# Patient Record
Sex: Female | Born: 1965 | Hispanic: Yes | Smoking: Never smoker
Health system: Southern US, Community
[De-identification: ages and names within clinical notes are randomized; demographics above are authoritative.]

## PROBLEM LIST (undated history)

## (undated) DIAGNOSIS — D373 Neoplasm of uncertain behavior of appendix: Secondary | ICD-10-CM

## (undated) DIAGNOSIS — K649 Unspecified hemorrhoids: Secondary | ICD-10-CM

## (undated) DIAGNOSIS — Z87442 Personal history of urinary calculi: Secondary | ICD-10-CM

## (undated) DIAGNOSIS — M1990 Unspecified osteoarthritis, unspecified site: Secondary | ICD-10-CM

## (undated) HISTORY — PX: CHOLECYSTECTOMY: SHX55

## (undated) HISTORY — PX: TUBAL LIGATION: SHX77

## (undated) HISTORY — DX: Neoplasm of uncertain behavior of appendix: D37.3

---

## 2013-01-23 ENCOUNTER — Ambulatory Visit: Payer: Self-pay | Admitting: Obstetrics and Gynecology

## 2016-01-22 ENCOUNTER — Emergency Department: Payer: Commercial Managed Care - PPO

## 2016-01-22 ENCOUNTER — Encounter: Payer: Self-pay | Admitting: Medical Oncology

## 2016-01-22 ENCOUNTER — Emergency Department
Admission: EM | Admit: 2016-01-22 | Discharge: 2016-01-22 | Disposition: A | Discharge status: Home / Self Care | Payer: Commercial Managed Care - PPO | Attending: Emergency Medicine | Admitting: Emergency Medicine

## 2016-01-22 DIAGNOSIS — R0789 Other chest pain: Secondary | ICD-10-CM | POA: Diagnosis not present

## 2016-01-22 DIAGNOSIS — R079 Chest pain, unspecified: Secondary | ICD-10-CM | POA: Diagnosis present

## 2016-01-22 LAB — CBC
HEMATOCRIT: 38.1 % (ref 35.0–47.0)
HEMOGLOBIN: 12.9 g/dL (ref 12.0–16.0)
MCH: 27.3 pg (ref 26.0–34.0)
MCHC: 33.8 g/dL (ref 32.0–36.0)
MCV: 80.7 fL (ref 80.0–100.0)
Platelets: 281 10*3/uL (ref 150–440)
RBC: 4.72 MIL/uL (ref 3.80–5.20)
RDW: 16.8 % — AB (ref 11.5–14.5)
WBC: 12.2 10*3/uL — AB (ref 3.6–11.0)

## 2016-01-22 LAB — BASIC METABOLIC PANEL
ANION GAP: 5 (ref 5–15)
BUN: 11 mg/dL (ref 6–20)
CO2: 27 mmol/L (ref 22–32)
Calcium: 9.4 mg/dL (ref 8.9–10.3)
Chloride: 106 mmol/L (ref 101–111)
Creatinine, Ser: 0.59 mg/dL (ref 0.44–1.00)
GFR calc Af Amer: >60 mL/min (ref >60)
Glucose, Bld: 122 mg/dL — ABNORMAL HIGH (ref 65–99)
POTASSIUM: 3.9 mmol/L (ref 3.5–5.1)
SODIUM: 138 mmol/L (ref 135–145)

## 2016-01-22 LAB — TROPONIN I: Troponin I: <0.03 ng/mL (ref <0.03)

## 2016-01-22 MED ORDER — KETOROLAC TROMETHAMINE 60 MG/2ML IM SOLN
60.0000 mg | Freq: Once | INTRAMUSCULAR | Status: AC
  Administered 2016-01-22: 60 mg via INTRAMUSCULAR
  Filled 2016-01-22: qty 2

## 2016-01-22 MED ORDER — IBUPROFEN 600 MG PO TABS: 600.0000 mg | ORAL_TABLET | Freq: Three times a day (TID) | ORAL | 0 refills | Status: DC | PRN

## 2016-01-22 NOTE — ED Notes (Signed)
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
## 2016-01-22 NOTE — Discharge Instructions (Signed)
Por favor, busque atencin mdica para cualquier fiebre alta, dolor en el pecho, dificultad para respirar, cambios de comportamiento, vmitos persistentes, heces sanguinolentas o cualquier otro sntoma nuevo o relacionado.

## 2016-01-22 NOTE — ED Triage Notes (Signed)
Pt reports she began having left sided chest pain/back pain and left arm pain Sunday night. Pain worsens with movement. Pt denies injury.

## 2017-02-10 ENCOUNTER — Other Ambulatory Visit: Payer: Self-pay | Admitting: Family Medicine

## 2017-02-10 DIAGNOSIS — Z1231 Encounter for screening mammogram for malignant neoplasm of breast: Secondary | ICD-10-CM

## 2017-03-02 ENCOUNTER — Encounter: Payer: Self-pay | Admitting: Radiology

## 2017-03-02 ENCOUNTER — Ambulatory Visit
Admission: RE | Admit: 2017-03-02 | Discharge: 2017-03-02 | Disposition: A | Discharge status: Home / Self Care | Payer: Commercial Managed Care - PPO | Source: Ambulatory Visit | Attending: Family Medicine | Admitting: Family Medicine

## 2017-03-02 DIAGNOSIS — Z1231 Encounter for screening mammogram for malignant neoplasm of breast: Secondary | ICD-10-CM

## 2017-06-27 ENCOUNTER — Other Ambulatory Visit: Payer: Self-pay

## 2017-06-27 ENCOUNTER — Telehealth: Payer: Self-pay

## 2017-06-27 DIAGNOSIS — Z1211 Encounter for screening for malignant neoplasm of colon: Secondary | ICD-10-CM

## 2017-06-27 MED ORDER — NA SULFATE-K SULFATE-MG SULF 17.5-3.13-1.6 GM/177ML PO SOLN: 1.0000 | Freq: Once | ORAL | 0 refills | Status: AC

## 2017-06-27 NOTE — Telephone Encounter (Signed)
Gastroenterology Pre-Procedure Review  Request Date: 07/07/17 Requesting Physician: Dr. Marius Ditch  PATIENT REVIEW QUESTIONS: The patient responded to the following health history questions as indicated:    1. Are you having any GI issues? no 2. Do you have a personal history of Polyps? no 3. Do you have a family history of Colon Cancer or Polyps? no 4. Diabetes Mellitus? no 5. Joint replacements in the past 12 months?no 6. Major health problems in the past 3 months?no 7. Any artificial heart valves, MVP, or defibrillator?no    MEDICATIONS & ALLERGIES:    Patient reports the following regarding taking any anticoagulation/antiplatelet therapy:   Plavix, Coumadin, Eliquis, Xarelto, Lovenox, Pradaxa, Brilinta, or Effient? no Aspirin? no  Patient confirms/reports the following medications:  Current Outpatient Medications  Medication Sig Dispense Refill  . ibuprofen (ADVIL,MOTRIN) 600 MG tablet Take 1 tablet (600 mg total) by mouth every 8 (eight) hours as needed. 20 tablet 0   No current facility-administered medications for this visit.     Patient confirms/reports the following allergies:  No Known Allergies  No orders of the defined types were placed in this encounter.   AUTHORIZATION INFORMATION Primary Insurance: 1D#: Group #:  Secondary Insurance: 1D#: Group #:  SCHEDULE INFORMATION: Date: 07/07/17 Time: Location:[facility]

## 2017-07-05 ENCOUNTER — Other Ambulatory Visit: Payer: Self-pay

## 2017-07-05 MED ORDER — PEG 3350-KCL-NA BICARB-NACL 420 G PO SOLR: 4000.0000 mL | Freq: Once | ORAL | 0 refills | Status: AC

## 2017-07-05 NOTE — Progress Notes (Signed)
This rx has been sent to pharmacy because insurance does not cover Su-Prep.

## 2017-07-06 ENCOUNTER — Encounter: Payer: Self-pay | Admitting: *Deleted

## 2017-07-07 ENCOUNTER — Encounter: Payer: Self-pay | Admitting: *Deleted

## 2017-07-07 ENCOUNTER — Ambulatory Visit: Payer: Commercial Managed Care - PPO | Admitting: Certified Registered Nurse Anesthetist

## 2017-07-07 ENCOUNTER — Encounter
Admission: RE | Disposition: A | Discharge status: Home / Self Care | Payer: Self-pay | Source: Ambulatory Visit | Attending: Gastroenterology

## 2017-07-07 ENCOUNTER — Ambulatory Visit
Admission: RE | Admit: 2017-07-07 | Discharge: 2017-07-07 | Disposition: A | Discharge status: Home / Self Care | Payer: Commercial Managed Care - PPO | Source: Ambulatory Visit | Attending: Gastroenterology | Admitting: Gastroenterology

## 2017-07-07 DIAGNOSIS — Z1211 Encounter for screening for malignant neoplasm of colon: Secondary | ICD-10-CM | POA: Diagnosis not present

## 2017-07-07 DIAGNOSIS — D124 Benign neoplasm of descending colon: Secondary | ICD-10-CM | POA: Diagnosis not present

## 2017-07-07 DIAGNOSIS — K639 Disease of intestine, unspecified: Secondary | ICD-10-CM | POA: Insufficient documentation

## 2017-07-07 DIAGNOSIS — D490 Neoplasm of unspecified behavior of digestive system: Secondary | ICD-10-CM | POA: Diagnosis not present

## 2017-07-07 DIAGNOSIS — Z6841 Body Mass Index (BMI) 40.0 and over, adult: Secondary | ICD-10-CM | POA: Insufficient documentation

## 2017-07-07 DIAGNOSIS — M1990 Unspecified osteoarthritis, unspecified site: Secondary | ICD-10-CM | POA: Diagnosis not present

## 2017-07-07 HISTORY — PX: COLONOSCOPY WITH PROPOFOL: SHX5780

## 2017-07-07 SURGERY — COLONOSCOPY WITH PROPOFOL
Anesthesia: General

## 2017-07-07 MED ORDER — LIDOCAINE HCL (CARDIAC) 20 MG/ML IV SOLN
INTRAVENOUS | Status: DC | PRN
  Administered 2017-07-07: 50 mg via INTRAVENOUS

## 2017-07-07 MED ORDER — SODIUM CHLORIDE 0.9 % IV SOLN
INTRAVENOUS | Status: DC
  Administered 2017-07-07: 10:00:00 via INTRAVENOUS

## 2017-07-07 MED ORDER — LIDOCAINE HCL (PF) 2 % IJ SOLN
INTRAMUSCULAR | Status: AC
  Filled 2017-07-07: qty 10

## 2017-07-07 MED ORDER — PROPOFOL 10 MG/ML IV BOLUS
INTRAVENOUS | Status: AC
  Filled 2017-07-07: qty 40

## 2017-07-07 MED ORDER — PROPOFOL 10 MG/ML IV BOLUS
INTRAVENOUS | Status: DC | PRN
  Administered 2017-07-07 (×4): 40 mg via INTRAVENOUS
  Administered 2017-07-07: 70 mg via INTRAVENOUS

## 2017-07-07 NOTE — Anesthesia Preprocedure Evaluation (Signed)
Anesthesia Evaluation  Patient identified by MRN, date of birth, ID band Patient awake    Reviewed: Allergy & Precautions, NPO status , Patient's Chart, lab work & pertinent test results  Airway Mallampati: III  TM Distance: <3 FB     Dental  (+) Caps, Chipped   Pulmonary neg pulmonary ROS,    Pulmonary exam normal        Cardiovascular negative cardio ROS Normal cardiovascular exam     Neuro/Psych negative neurological ROS  negative psych ROS   GI/Hepatic negative GI ROS,   Endo/Other  Morbid obesity  Renal/GU negative Renal ROS  negative genitourinary   Musculoskeletal  (+) Arthritis , Osteoarthritis,    Abdominal Normal abdominal exam  (+)   Peds negative pediatric ROS (+)  Hematology negative hematology ROS (+)   Anesthesia Other Findings   Reproductive/Obstetrics                             Anesthesia Physical Anesthesia Plan  ASA: II  Anesthesia Plan: General   Post-op Pain Management:    Induction: Intravenous  PONV Risk Score and Plan:   Airway Management Planned: Nasal Cannula  Additional Equipment:   Intra-op Plan:   Post-operative Plan:   Informed Consent: I have reviewed the patients History and Physical, chart, labs and discussed the procedure including the risks, benefits and alternatives for the proposed anesthesia with the patient or authorized representative who has indicated his/her understanding and acceptance.   Dental advisory given  Plan Discussed with: CRNA and Surgeon  Anesthesia Plan Comments:         Anesthesia Quick Evaluation

## 2017-07-07 NOTE — Anesthesia Post-op Follow-up Note (Signed)
Anesthesia QCDR form completed.        

## 2017-07-07 NOTE — Op Note (Signed)
[facility] Gastroenterology Patient Name: Jaime Brock Procedure Date: 07/07/2017 9:36 AM MRN: [id number] Account #: [id number] Date of Birth: [birth year] Admit Type: Outpatient Age: 52 Room: [facility] ENDO ROOM 4 Gender: Female Note Status: Finalized Procedure:            Colonoscopy Indications:          Screening for colorectal malignant neoplasm, This is                        the patient's first colonoscopy Providers:            Lin Landsman MD, MD Referring MD:         [facility]Barton Dubois (Referring MD) Medicines:            Monitored Anesthesia Care Complications:        No immediate complications. Estimated blood loss: None. Procedure:            Pre-Anesthesia Assessment:                       - Prior to the procedure, a History and Physical was                        performed, and patient medications and allergies were                        reviewed. The patient is competent. The risks and                        benefits of the procedure and the sedation options and                        risks were discussed with the patient. All questions                        were answered and informed consent was obtained.                        Patient identification and proposed procedure were                        verified by the physician, the nurse, the                        anesthesiologist, the anesthetist and the technician in                        the pre-procedure area in the procedure room in the                        endoscopy suite. Mental Status Examination: alert and                        oriented. Airway Examination: normal oropharyngeal                        airway and neck mobility. Respiratory Examination:                        clear to auscultation. CV Examination: normal.  Prophylactic Antibiotics: The patient does not require                        prophylactic antibiotics. Prior Anticoagulants: The                         patient has taken no previous anticoagulant or                        antiplatelet agents. ASA Grade Assessment: II - A                        patient with mild systemic disease. After reviewing the                        risks and benefits, the patient was deemed in                        satisfactory condition to undergo the procedure. The                        anesthesia plan was to use monitored anesthesia care                        (MAC). Immediately prior to administration of                        medications, the patient was re-assessed for adequacy                        to receive sedatives. The heart rate, respiratory rate,                        oxygen saturations, blood pressure, adequacy of                        pulmonary ventilation, and response to care were                        monitored throughout the procedure. The physical status                        of the patient was re-assessed after the procedure.                       After obtaining informed consent, the colonoscope was                        passed under direct vision. Throughout the procedure,                        the patient's blood pressure, pulse, and oxygen                        saturations were monitored continuously. The                        Colonoscope was introduced through the anus and  advanced to the the cecum, identified by appendiceal                        orifice and ileocecal valve. The colonoscopy was                        performed without difficulty. The patient tolerated the                        procedure well. The quality of the bowel preparation                        was evaluated using the BBPS [facility] Bowel Preparation                        Scale) with scores of: Right Colon = 3, Transverse                        Colon = 3 and Left Colon = 3 (entire mucosa seen well                        with no residual staining, small  fragments of stool or                        opaque liquid). The total BBPS score equals 9. Findings:      The perianal and digital rectal examinations were normal. Pertinent       negatives include normal sphincter tone and no palpable rectal lesions.      A 10 mm polypoid lesion was found appendiceal orifice. The lesion was       polypoid and submucosal. No bleeding was present.      A 7 mm polyp was found in the descending colon. The polyp was       semi-pedunculated. The polyp was removed with a hot snare. Resection and       retrieval were complete.      The retroflexed view of the distal rectum and anal verge was normal and       showed no anal or rectal abnormalities.      The exam was otherwise without abnormality. Impression:           - Likely benign polypoid lesion at the appendiceal                        orifice.                       - One 7 mm polyp in the descending colon, removed with                        a hot snare. Resected and retrieved.                       - The distal rectum and anal verge are normal on                        retroflexion view.                       - The examination was otherwise normal. Recommendation:       -  Discharge patient to home.                       - Resume previous diet today.                       - Continue present medications.                       - Await pathology results.                       - Perform a computed tomographic (CT scan) enterography                        to evaluate polypoid lesion on appendiceal orifice at                        appointment to be scheduled.                       - Repeat colonoscopy in 5 years for surveillance. Procedure Code(s):    --- Professional ---                       [phone], Colonoscopy, flexible; with removal of tumor(s),                        polyp(s), or other lesion(s) by snare technique Diagnosis Code(s):    --- Professional ---                       Z12.11, Encounter for  screening for malignant neoplasm                        of colon                       D49.0, Neoplasm of unspecified behavior of digestive                        system                       D12.4, Benign neoplasm of descending colon CPT copyright 2016 American Medical Association. All rights reserved. The codes documented in this report are preliminary and upon coder review may  be revised to meet current compliance requirements. Dr. Ulyess Mort Lin Landsman MD, MD 07/07/2017 10:08:06 AM This report has been signed electronically. Number of Addenda: 0 Note Initiated On: 07/07/2017 9:36 AM Scope Withdrawal Time: 0 hours 10 minutes 51 seconds  Total Procedure Duration: 0 hours 13 minutes 41 seconds       [facility]

## 2017-07-07 NOTE — Transfer of Care (Signed)
Immediate Anesthesia Transfer of Care Note  Patient: Jaime Brock  Procedure(s) Performed: COLONOSCOPY WITH PROPOFOL (N/A )  Patient Location: PACU and Endoscopy Unit  Anesthesia Type:General  Level of Consciousness: sedated  Airway & Oxygen Therapy: Patient Spontanous Breathing  Post-op Assessment: Report given to RN  Post vital signs: stable  Last Vitals:  Vitals:   07/07/17 0910 07/07/17 1000  BP: 124/74   Pulse: 66 66  Resp: 16 16  Temp: (!) 36.2 C 36.7 C  SpO2: 100% 100%    Last Pain:  Vitals:   07/07/17 1000  TempSrc: Tympanic         Complications: No apparent anesthesia complications

## 2017-07-07 NOTE — H&P (Addendum)
  Cephas Darby, MD [address]  [place]  [place], [place] [address]  Main: [phone]  Fax: [phone] Pager: [phone]  Primary Care Physician:  [facility] Primary Gastroenterologist:  Dr. Cephas Darby  Pre-Procedure History & Physical: HPI:  Jaime Brock is a 52 y.o. female is here for an colonoscopy.   History reviewed. No pertinent past medical history.  Past Surgical History:  Procedure Laterality Date  . CHOLECYSTECTOMY    . TUBAL LIGATION      Prior to Admission medications   Medication Sig Start Date End Date Taking? Authorizing Provider  ibuprofen (ADVIL,MOTRIN) 600 MG tablet Take 1 tablet (600 mg total) by mouth every 8 (eight) hours as needed. 01/22/16   Nance Pear, MD    Allergies as of 06/27/2017  . (No Known Allergies)    Family History  Problem Relation Age of Onset  . Breast cancer Neg Hx     Social History   Socioeconomic History  . Marital status: Widowed    Spouse name: Not on file  . Number of children: Not on file  . Years of education: Not on file  . Highest education level: Not on file  Social Needs  . Financial resource strain: Not on file  . Food insecurity - worry: Not on file  . Food insecurity - inability: Not on file  . Transportation needs - medical: Not on file  . Transportation needs - non-medical: Not on file  Occupational History  . Not on file  Tobacco Use  . Smoking status: Never Smoker  . Smokeless tobacco: Never Used  Substance and Sexual Activity  . Alcohol use: No    Frequency: Never  . Drug use: No  . Sexual activity: Not on file  Other Topics Concern  . Not on file  Social History Narrative  . Not on file    Review of Systems: See HPI, otherwise negative ROS  Physical Exam: BP 124/74   Pulse 66   Temp (!) 97.1 F (36.2 C) (Tympanic)   Resp 16   Ht 4\' 5"  (1.346 m)   Wt 209 lb (94.8 kg)   LMP  (LMP Unknown)   SpO2 100%   BMI 52.31 kg/m  General:    Alert,  pleasant and cooperative in NAD Head:  Normocephalic and atraumatic. Neck:  Supple; no masses or thyromegaly. Lungs:  Clear throughout to auscultation.    Heart:  Regular rate and rhythm. Abdomen:  Soft, nontender and nondistended. Normal bowel sounds, without guarding, and without rebound.   Neurologic:  Alert and  oriented x4;  grossly normal neurologically.  Impression/Plan: Jaime Brock is here for an colonoscopy to be performed for colon cancer screening   Risks, benefits, limitations, and alternatives regarding  colonoscopy have been reviewed with the patient.  Questions have been answered.  All parties agreeable.   Sherri Sear, MD  07/07/2017, 9:43 AM

## 2017-07-07 NOTE — Anesthesia Postprocedure Evaluation (Signed)
Anesthesia Post Note  Patient: Yolonda Purtle  Procedure(s) Performed: COLONOSCOPY WITH PROPOFOL (N/A )  Patient location during evaluation: PACU Anesthesia Type: General Level of consciousness: awake and alert Pain management: pain level controlled Vital Signs Assessment: post-procedure vital signs reviewed and stable Respiratory status: spontaneous breathing, nonlabored ventilation, respiratory function stable and patient connected to nasal cannula oxygen Cardiovascular status: blood pressure returned to baseline and stable Postop Assessment: no apparent nausea or vomiting Anesthetic complications: no     Last Vitals:  Vitals:   07/07/17 1010 07/07/17 1020  BP: (!) 95/56 (!) 89/51  Pulse: 71 65  Resp: (!) 24 (!) 26  Temp:    SpO2: 99% 100%    Last Pain:  Vitals:   07/07/17 1000  TempSrc: Tympanic                 Molli Barrows

## 2017-07-08 ENCOUNTER — Encounter: Payer: Self-pay | Admitting: Gastroenterology

## 2017-07-08 LAB — SURGICAL PATHOLOGY

## 2017-07-11 ENCOUNTER — Encounter: Payer: Self-pay | Admitting: Gastroenterology

## 2017-07-12 ENCOUNTER — Telehealth: Payer: Self-pay

## 2017-07-12 NOTE — Telephone Encounter (Signed)
Patient has been scheduled her CT Enterography at [facility] on 07/19/17 arrival time 10 am, nothing to eat or drink after midnight.  This message has been left on patients voice mail in [place] and [place] with the assistance of the interpreter.  Thanks [employer]

## 2017-07-19 ENCOUNTER — Ambulatory Visit
Admission: RE | Admit: 2017-07-19 | Discharge: 2017-07-19 | Disposition: A | Discharge status: Home / Self Care | Payer: Commercial Managed Care - PPO | Source: Ambulatory Visit | Attending: Gastroenterology | Admitting: Gastroenterology

## 2017-07-19 DIAGNOSIS — K381 Appendicular concretions: Secondary | ICD-10-CM | POA: Insufficient documentation

## 2017-07-19 DIAGNOSIS — K639 Disease of intestine, unspecified: Secondary | ICD-10-CM | POA: Diagnosis present

## 2017-07-19 DIAGNOSIS — N261 Atrophy of kidney (terminal): Secondary | ICD-10-CM | POA: Insufficient documentation

## 2017-07-19 DIAGNOSIS — M5136 Other intervertebral disc degeneration, lumbar region: Secondary | ICD-10-CM | POA: Diagnosis not present

## 2017-07-19 DIAGNOSIS — N200 Calculus of kidney: Secondary | ICD-10-CM | POA: Diagnosis not present

## 2017-07-19 MED ORDER — IOPAMIDOL (ISOVUE-300) INJECTION 61%
100.0000 mL | Freq: Once | INTRAVENOUS | Status: AC | PRN
  Administered 2017-07-19: 100 mL via INTRAVENOUS

## 2017-07-20 ENCOUNTER — Telehealth: Payer: Self-pay

## 2017-07-20 ENCOUNTER — Other Ambulatory Visit: Payer: Self-pay

## 2017-07-20 DIAGNOSIS — K639 Disease of intestine, unspecified: Secondary | ICD-10-CM

## 2017-07-20 NOTE — Telephone Encounter (Signed)
LVM for pt to contact office to get her lab results.

## 2017-07-20 NOTE — Progress Notes (Signed)
done

## 2017-07-26 NOTE — Telephone Encounter (Signed)
error 

## 2017-07-27 ENCOUNTER — Telehealth: Payer: Self-pay | Admitting: General Practice

## 2017-07-27 NOTE — Telephone Encounter (Signed)
Called Mr. Jaime Brock and answered his questions. He also wanted to switch his mother's appointment to be seen sooner. However, I told Jaime Brock that when I spoke to his mom, she was ademant to be seen until 08/17/2017 and in the afternoon so she could go to work in the morning. Jaime Brock then said that he will call his mother to make sure and that he will call us back to reschedule if she agreed to be seen sooner. I also told him that his mom wanted to be seen by a [place] speaking physician. So, I told Jaime Brock that I had a physician next week and then until the week of 08/17/2017. Jaime Brock understood and had no further questions.

## 2017-07-27 NOTE — Telephone Encounter (Signed)
Patients son was calling and just has questions about his mothers appointment. Please call and advise.

## 2017-08-17 ENCOUNTER — Ambulatory Visit: Payer: Commercial Managed Care - PPO | Admitting: Surgery

## 2017-08-17 VITALS — BP 123/73 | HR 73 | Temp 97.8°F | Ht 59.0 in | Wt 213.0 lb

## 2017-08-17 DIAGNOSIS — K388 Other specified diseases of appendix: Secondary | ICD-10-CM | POA: Insufficient documentation

## 2017-08-17 MED ORDER — ERYTHROMYCIN BASE 500 MG PO TABS: 500.0000 mg | ORAL_TABLET | Freq: Three times a day (TID) | ORAL | 0 refills | Status: DC

## 2017-08-17 MED ORDER — POLYETHYLENE GLYCOL 3350 17 GM/SCOOP PO POWD: 1.0000 | Freq: Once | ORAL | 0 refills | Status: AC

## 2017-08-17 MED ORDER — BISACODYL EC 5 MG PO TBEC: DELAYED_RELEASE_TABLET | ORAL | 0 refills | Status: DC

## 2017-08-17 MED ORDER — NEOMYCIN SULFATE 500 MG PO TABS: 500.0000 mg | ORAL_TABLET | Freq: Three times a day (TID) | ORAL | 0 refills | Status: DC

## 2017-08-17 NOTE — Patient Instructions (Signed)
Por favor llamenos si tiene alguna preguntas.   Vea su papel azul si tiene preguntas sobre su [place].

## 2017-08-17 NOTE — Progress Notes (Signed)
08/17/2017  Reason for Visit:  Appendiceal mucocele  Referring Provider:  Sherri Sear, MD  History of Present Illness: Jaime Brock is a 52 y.o. 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  The patient had a routine screening colonoscopy on 2/28 with Dr. Marius Ditch.  Colonoscopy showed a descending colon polyp which was removed and resulted in a tubular adenoma, and also a submucosal 10 mm polypoid lesion at the appendiceal orifice.  She had a subsequent CT scan which showed a dilated appendix to 10 mm with an appendicolith distally and a possible 1.5 x 1.2 cm lesion at the appendiceal orifice, which could be a mucocele.  The patient denies any symptoms.  She reports that her colonoscopy was for routine screening purposes and she's wondering why she's in our office today.  She denies any abdominal pain, nausea, vomiting, constipation or diarrhea or blood in the stool.  Past Medical History: --None  Past Surgical History: Past Surgical History:  Procedure Laterality Date  . CHOLECYSTECTOMY    . COLONOSCOPY WITH PROPOFOL N/A 07/07/2017   Procedure: COLONOSCOPY WITH PROPOFOL;  Surgeon: Lin Landsman, MD;  Location: [facility] ENDOSCOPY;  Service: Gastroenterology;  Laterality: N/A;  . TUBAL LIGATION      Home Medications: Prior to Admission medications   Medication Sig Start Date End Date Taking? Authorizing Provider  ibuprofen (ADVIL,MOTRIN) 600 MG tablet Take 1 tablet (600 mg total) by mouth every 8 (eight) hours as needed. 01/22/16  Yes Nance Pear, MD  BISACODYL 5 MG EC tablet Take 4 tablets at 8AM the day of your surgery. 08/30/17   Olean Ree, MD  erythromycin base (E-MYCIN) 500 MG tablet Take 1 tablet (500 mg total) by mouth 3 (three) times daily. Take 2 tablets at 8: 00 AM, 2:00 PM and 8:00 PM the day before surgery. 08/30/17   Kilah Drahos, Jacqulyn Bath, MD  neomycin (MYCIFRADIN) 500 MG tablet Take 1 tablet (500 mg total) by mouth 3 (three) times daily. Take 2 tablets at  8: 00 AM, 2:00 PM and 8:00 PM the day before surgery. 08/30/17   Kendrell Lottman, Jacqulyn Bath, MD  polyethylene glycol powder (GLYCOLAX/MIRALAX) powder Take 255 g by mouth once for 1 dose. Take 255 g by mouth starting at 2:00 PM the day before surgery until it is all gone. 08/30/17 08/30/17  Olean Ree, MD    Allergies: No Known Allergies  Social History:  reports that she has never smoked. She has never used smokeless tobacco. She reports that she does not drink alcohol or use drugs.   Family History: Family History  Problem Relation Age of Onset  . Diabetes Mother   . Gallbladder disease Mother   . Hypertension Mother   . Breast cancer Neg Hx   . Cancer Neg Hx     Review of Systems: Review of Systems  Constitutional: Negative for chills and fever.  HENT: Negative for hearing loss.   Respiratory: Negative for shortness of breath.   Cardiovascular: Negative for chest pain.  Gastrointestinal: Negative for abdominal pain, blood in stool, constipation, diarrhea, nausea and vomiting.  Genitourinary: Negative for dysuria.  Musculoskeletal: Negative for myalgias.  Skin: Negative for rash.  Neurological: Negative for dizziness.  Psychiatric/Behavioral: Negative for depression.    Physical Exam BP 123/73   Pulse 73   Temp 97.8 F (36.6 C) (Oral)   Ht 4\' 11"  (1.499 m)   Wt 213 lb (96.6 kg)   LMP  (LMP Unknown) Comment: Pt denies any chance of preg, pt does not wnat  to take preg test.   BMI 43.02 kg/m  CONSTITUTIONAL: No acute distress HEENT:  Normocephalic, atraumatic, extraocular motion intact. NECK: Trachea is midline, and there is no jugular venous distension.  RESPIRATORY:  Lungs are clear, and breath sounds are equal bilaterally. Normal respiratory effort without pathologic use of accessory muscles. CARDIOVASCULAR: Heart is regular without murmurs, gallops, or rubs. GI: The abdomen is soft, obese, nondistended, nontender to palpation.  Patient has umbilical incision from tubal ligation  and right subcostal incision from cholecystectomy, both well healed. There were no palpable masses.  MUSCULOSKELETAL:  Normal muscle strength and tone in all four extremities.  No peripheral edema or cyanosis. SKIN: Skin turgor is normal. There are no pathologic skin lesions.  NEUROLOGIC:  Motor and sensation is grossly normal.  Cranial nerves are grossly intact. PSYCH:  Alert and oriented to person, place and time. Affect is normal.  Laboratory Analysis: No results found for this or any previous visit (from the past 24 hour(s)).  Imaging: CT scan 3/12: IMPRESSION: 1. Abnormal caliber of the appendix at 10 mm as measured on image 68/6, with prominence of the appendix near the orifice from the cecum better seen on the delayed phase images. Appearance is not entirely specific, but given the lack of appreciable enhancement favors appendiceal mucocele. There is also a more distal appendicolith. Appendectomy should be considered. 2. 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 Mild associated left hydronephrosis. 3. Spurring of the sacroiliac joints. Lower lumbar degenerative disc disease.  Assessment and Plan: This is a 52 y.o. female who presents with a newly diagnosed appendiceal orifice lesion, possibly a mucocele.  I have independently viewed the patient's imaging study and agree that there is a lesion at the appendiceal orifice, unclear of what it is.  I also cannot fully see how much distance there is from this lesion to the ileocecal valve.  Discussed with the patient that the recommendation for this lesion would be resection.  The goal would be a laparoscopic appendectomy, with resecting a sliver of cecum in order to clear the orifice well and get clear margins.  However, also discussed with the patient that I cannot see for sure how much distance there is from the  orifice to the ileocecal valve, and if there is not much separation, I may not be able to remove a sliver of cecum and she could potentially need a laparoscopic right colectomy.  Discussed with the patient the risks of bleeding, infection, and injury to surrounding structures, and she's willing to proceed.  Given that she may need a more extensive surgery, would want her to get a bowel prep prior to surgery.  She will be scheduled for 08/31/17.  Patient is in agreement with this plan and all of her questions have been answered.  Face-to-face time spent with the patient and care providers was 60 minutes, with more than 50% of the time spent counseling, educating, and coordinating care of the patient.     Melvyn Neth, [place]

## 2017-08-17 NOTE — H&P (View-Only) (Signed)
08/17/2017  Reason for Visit:  Appendiceal mucocele  Referring Provider:  Sherri Sear, MD  History of Present Illness: Jaime Brock is a 52 y.o. 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  The patient had a routine screening colonoscopy on 2/28 with Dr. Marius Ditch.  Colonoscopy showed a descending colon polyp which was removed and resulted in a tubular adenoma, and also a submucosal 10 mm polypoid lesion at the appendiceal orifice.  She had a subsequent CT scan which showed a dilated appendix to 10 mm with an appendicolith distally and a possible 1.5 x 1.2 cm lesion at the appendiceal orifice, which could be a mucocele.  The patient denies any symptoms.  She reports that her colonoscopy was for routine screening purposes and she's wondering why she's in our office today.  She denies any abdominal pain, nausea, vomiting, constipation or diarrhea or blood in the stool.  Past Medical History: --None  Past Surgical History: Past Surgical History:  Procedure Laterality Date  . CHOLECYSTECTOMY    . COLONOSCOPY WITH PROPOFOL N/A 07/07/2017   Procedure: COLONOSCOPY WITH PROPOFOL;  Surgeon: Lin Landsman, MD;  Location[facility] ENDOSCOPY;  Service: Gastroenterology;  Laterality: N/A;  . TUBAL LIGATION      Home Medications: Prior to Admission medications   Medication Sig Start Date End Date Taking? Authorizing Provider  ibuprofen (ADVIL,MOTRIN) 600 MG tablet Take 1 tablet (600 mg total) by mouth every 8 (eight) hours as needed. 01/22/16  Yes Nance Pear, MD  BISACODYL 5 MG EC tablet Take 4 tablets at 8AM the day of your surgery. 08/30/17   Olean Ree, MD  erythromycin base (E-MYCIN) 500 MG tablet Take 1 tablet (500 mg total) by mouth 3 (three) times daily. Take 2 tablets at 8: 00 AM, 2:00 PM and 8:00 PM the day before surgery. 08/30/17   Hadleigh Felber, Jacqulyn Bath, MD  neomycin (MYCIFRADIN) 500 MG tablet Take 1 tablet (500 mg total) by mouth 3 (three) times daily. Take 2 tablets at  8: 00 AM, 2:00 PM and 8:00 PM the day before surgery. 08/30/17   Shakela Donati, Jacqulyn Bath, MD  polyethylene glycol powder (GLYCOLAX/MIRALAX) powder Take 255 g by mouth once for 1 dose. Take 255 g by mouth starting at 2:00 PM the day before surgery until it is all gone. 08/30/17 08/30/17  Olean Ree, MD    Allergies: No Known Allergies  Social History:  reports that she has never smoked. She has never used smokeless tobacco. She reports that she does not drink alcohol or use drugs.   Family History: Family History  Problem Relation Age of Onset  . Diabetes Mother   . Gallbladder disease Mother   . Hypertension Mother   . Breast cancer Neg Hx   . Cancer Neg Hx     Review of Systems: Review of Systems  Constitutional: Negative for chills and fever.  HENT: Negative for hearing loss.   Respiratory: Negative for shortness of breath.   Cardiovascular: Negative for chest pain.  Gastrointestinal: Negative for abdominal pain, blood in stool, constipation, diarrhea, nausea and vomiting.  Genitourinary: Negative for dysuria.  Musculoskeletal: Negative for myalgias.  Skin: Negative for rash.  Neurological: Negative for dizziness.  Psychiatric/Behavioral: Negative for depression.    Physical Exam BP 123/73   Pulse 73   Temp 97.8 F (36.6 C) (Oral)   Ht 4\' 11"  (1.499 m)   Wt 213 lb (96.6 kg)   LMP  (LMP Unknown) Comment: Pt denies any chance of preg, pt does not wnat  to take preg test.   BMI 43.02 kg/m  CONSTITUTIONAL: No acute distress HEENT:  Normocephalic, atraumatic, extraocular motion intact. NECK: Trachea is midline, and there is no jugular venous distension.  RESPIRATORY:  Lungs are clear, and breath sounds are equal bilaterally. Normal respiratory effort without pathologic use of accessory muscles. CARDIOVASCULAR: Heart is regular without murmurs, gallops, or rubs. GI: The abdomen is soft, obese, nondistended, nontender to palpation.  Patient has umbilical incision from tubal ligation  and right subcostal incision from cholecystectomy, both well healed. There were no palpable masses.  MUSCULOSKELETAL:  Normal muscle strength and tone in all four extremities.  No peripheral edema or cyanosis. SKIN: Skin turgor is normal. There are no pathologic skin lesions.  NEUROLOGIC:  Motor and sensation is grossly normal.  Cranial nerves are grossly intact. PSYCH:  Alert and oriented to person, place and time. Affect is normal.  Laboratory Analysis: No results found for this or any previous visit (from the past 24 hour(s)).  Imaging: CT scan 3/12: IMPRESSION: 1. Abnormal caliber of the appendix at 10 mm as measured on image 68/6, with prominence of the appendix near the orifice from the cecum better seen on the delayed phase images. Appearance is not entirely specific, but given the lack of appreciable enhancement favors appendiceal mucocele. There is also a more distal appendicolith. Appendectomy should be considered. 2. 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 Mild associated left hydronephrosis. 3. Spurring of the sacroiliac joints. Lower lumbar degenerative disc disease.  Assessment and Plan: This is a 52 y.o. female who presents with a newly diagnosed appendiceal orifice lesion, possibly a mucocele.  I have independently viewed the patient's imaging study and agree that there is a lesion at the appendiceal orifice, unclear of what it is.  I also cannot fully see how much distance there is from this lesion to the ileocecal valve.  Discussed with the patient that the recommendation for this lesion would be resection.  The goal would be a laparoscopic appendectomy, with resecting a sliver of cecum in order to clear the orifice well and get clear margins.  However, also discussed with the patient that I cannot see for sure how much distance there is from the  orifice to the ileocecal valve, and if there is not much separation, I may not be able to remove a sliver of cecum and she could potentially need a laparoscopic right colectomy.  Discussed with the patient the risks of bleeding, infection, and injury to surrounding structures, and she's willing to proceed.  Given that she may need a more extensive surgery, would want her to get a bowel prep prior to surgery.  She will be scheduled for 08/31/17.  Patient is in agreement with this plan and all of her questions have been answered.  Face-to-face time spent with the patient and care providers was 60 minutes, with more than 50% of the time spent counseling, educating, and coordinating care of the patient.     Melvyn Neth, [place]

## 2017-08-19 ENCOUNTER — Telehealth: Payer: Self-pay | Admitting: Surgery

## 2017-08-19 NOTE — Telephone Encounter (Signed)
Pt advised of pre op date/time and sx date. Sx: 08/31/17 with Dr Audry Riles appendectomy, possible laparoscopic right colectomy.  Pre op: 08/23/17 @ 12:45pm--office interview.   Patient made aware to call [phone], between 1-3:00pm the day before surgery, to find out what time to arrive.

## 2017-08-23 ENCOUNTER — Other Ambulatory Visit: Payer: Self-pay

## 2017-08-23 ENCOUNTER — Encounter
Admission: RE | Admit: 2017-08-23 | Discharge: 2017-08-23 | Disposition: A | Discharge status: Home / Self Care | Payer: Commercial Managed Care - PPO | Source: Ambulatory Visit | Attending: Surgery | Admitting: Surgery

## 2017-08-23 NOTE — Pre-Procedure Instructions (Signed)
Instructions for surgery reviewed with patient and son, with Spanish interpreter Maritza A. Questions answered.  Patient has questions re: post-surgery lifestyle changes, possible side effects, etc.  Patient encouraged to discuss and ask questions w/ Dr. Hampton Abbot.  Pt expressed understanding.

## 2017-08-23 NOTE — Patient Instructions (Signed)
  Your procedure is scheduled on: Wednesday August 31, 2017 Report to Same Day Surgery 2nd floor medical mall ([place] Entrance-take elevator on left to 2nd floor.  Check in with surgery information desk.) To find out your arrival time please call [phone] between 1PM - 3PM on Tuesday August 30, 2017  Remember: Instructions that are not followed completely may result in serious medical risk, up to and including death, or upon the discretion of your surgeon and anesthesiologist your surgery may need to be rescheduled.    _x___ 1. Do not eat food after midnight the night before your procedure. You may drink clear liquids up to 2 hours before you are scheduled to arrive at the hospital for your procedure.  Do not drink clear liquids within 2 hours of your scheduled arrival to the hospital.  Clear liquids include  --Water or Apple juice without pulp  --Clear carbohydrate beverage such as Gatorade  --Black Coffee or Clear Tea (No milk, no creamers, do not add anything to the coffee or tea   No gum chewing or hard candies.      __x__ 2. No Alcohol for 24 hours before or after surgery.   __x__3. No Smoking or e-cigarettes for 24 prior to surgery.  Do not use any chewable tobacco products for at least 6 hour prior to surgery   ____  4. Bring all medications with you on the day of surgery if instructed.    __x__ 5. Notify your doctor if there is any change in your medical condition     (cold, fever, infections).   __x__6. On the morning of surgery brush your teeth with toothpaste and water.  You may rinse your mouth with mouth wash if you wish.  Do not swallow any toothpaste or mouthwash.   Do not wear jewelry, make-up, hairpins, clips or nail polish.  Do not wear lotions, powders, deodorant, or perfumes.   Do not shave 48 hours prior to surgery.   Do not bring valuables to the hospital.    [facility] is not responsible for any belongings or valuables.               Contacts,  dentures or bridgework may not be worn into surgery.  Leave your suitcase in the car. After surgery it may be brought to your room.  For patients admitted to the hospital, discharge time is determined by your treatment team.  Please read over the following fact sheets that you were given:   [facility] Preparing for Surgery and or MRSA Information   _x___ Take anti-hypertensive listed below, cardiac, seizure, asthma, anti-reflux and psychiatric medicines. These include:  1. As directed by Dr. Hampton Abbot   _x___ Use CHG Soap or sage wipes as directed on instruction sheet   _x___ Follow recommendations from Cardiologist, Pulmonologist or PCP regarding stopping Aspirin, Coumadin, Plavix ,Eliquis, Effient, or Pradaxa, and Pletal.  _x___Stop Anti-inflammatories such as Advil, Aleve, Ibuprofen, Motrin, Naproxen, Naprosyn, Goodies powders or aspirin products. OK to take Tylenol and Celebrex. Stop taking Ibuprofen until after surgery, starting today.   _x___ Stop supplements until after surgery.  But may continue Vitamin D, Vitamin B, and multivitamin. Stop taking honey syrup until after surgery, starting today.

## 2017-08-30 MED ORDER — SODIUM CHLORIDE 0.9 % IV SOLN
2.0000 g | INTRAVENOUS | Status: AC
  Administered 2017-08-31: 2 g via INTRAVENOUS
  Filled 2017-08-30: qty 2

## 2017-08-31 ENCOUNTER — Ambulatory Visit
Admission: RE | Admit: 2017-08-31 | Discharge: 2017-08-31 | Disposition: A | Discharge status: Home / Self Care | Payer: Commercial Managed Care - PPO | Source: Ambulatory Visit | Attending: Surgery | Admitting: Surgery

## 2017-08-31 ENCOUNTER — Encounter
Admission: RE | Disposition: A | Discharge status: Home / Self Care | Payer: Self-pay | Source: Ambulatory Visit | Attending: Surgery

## 2017-08-31 ENCOUNTER — Ambulatory Visit: Payer: Commercial Managed Care - PPO | Admitting: Certified Registered Nurse Anesthetist

## 2017-08-31 ENCOUNTER — Other Ambulatory Visit: Payer: Self-pay

## 2017-08-31 DIAGNOSIS — Z6841 Body Mass Index (BMI) 40.0 and over, adult: Secondary | ICD-10-CM | POA: Insufficient documentation

## 2017-08-31 DIAGNOSIS — Z9049 Acquired absence of other specified parts of digestive tract: Secondary | ICD-10-CM | POA: Diagnosis not present

## 2017-08-31 DIAGNOSIS — E669 Obesity, unspecified: Secondary | ICD-10-CM | POA: Diagnosis not present

## 2017-08-31 DIAGNOSIS — K388 Other specified diseases of appendix: Secondary | ICD-10-CM | POA: Diagnosis not present

## 2017-08-31 DIAGNOSIS — D121 Benign neoplasm of appendix: Secondary | ICD-10-CM | POA: Diagnosis not present

## 2017-08-31 HISTORY — PX: LAPAROSCOPIC APPENDECTOMY: SHX408

## 2017-08-31 LAB — POCT PREGNANCY, URINE: PREG TEST UR: NEGATIVE

## 2017-08-31 SURGERY — APPENDECTOMY, LAPAROSCOPIC
Anesthesia: General

## 2017-08-31 MED ORDER — GABAPENTIN 300 MG PO CAPS
ORAL_CAPSULE | ORAL | Status: AC
  Administered 2017-08-31: 300 mg via ORAL
  Filled 2017-08-31: qty 1

## 2017-08-31 MED ORDER — BUPIVACAINE-EPINEPHRINE (PF) 0.5% -1:200000 IJ SOLN
INTRAMUSCULAR | Status: AC
  Filled 2017-08-31: qty 30

## 2017-08-31 MED ORDER — ONDANSETRON HCL 4 MG/2ML IJ SOLN
INTRAMUSCULAR | Status: AC
  Filled 2017-08-31: qty 2

## 2017-08-31 MED ORDER — MIDAZOLAM HCL 2 MG/2ML IJ SOLN
INTRAMUSCULAR | Status: AC
  Filled 2017-08-31: qty 2

## 2017-08-31 MED ORDER — ONDANSETRON HCL 4 MG/2ML IJ SOLN
4.0000 mg | Freq: Once | INTRAMUSCULAR | Status: AC | PRN
  Administered 2017-08-31: 4 mg via INTRAVENOUS

## 2017-08-31 MED ORDER — ONDANSETRON HCL 4 MG/2ML IJ SOLN
INTRAMUSCULAR | Status: DC | PRN
  Administered 2017-08-31: 4 mg via INTRAVENOUS

## 2017-08-31 MED ORDER — LIDOCAINE HCL (PF) 2 % IJ SOLN
INTRAMUSCULAR | Status: AC
  Filled 2017-08-31: qty 10

## 2017-08-31 MED ORDER — LACTATED RINGERS IV SOLN
INTRAVENOUS | Status: DC
  Administered 2017-08-31 (×2): via INTRAVENOUS

## 2017-08-31 MED ORDER — ROCURONIUM BROMIDE 100 MG/10ML IV SOLN
INTRAVENOUS | Status: DC | PRN
  Administered 2017-08-31: 35 mg via INTRAVENOUS
  Administered 2017-08-31: 10 mg via INTRAVENOUS
  Administered 2017-08-31: 5 mg via INTRAVENOUS

## 2017-08-31 MED ORDER — ACETAMINOPHEN 500 MG PO TABS
ORAL_TABLET | ORAL | Status: AC
  Administered 2017-08-31: 1000 mg via ORAL
  Filled 2017-08-31: qty 2

## 2017-08-31 MED ORDER — OXYCODONE HCL 5 MG PO TABS
5.0000 mg | ORAL_TABLET | Freq: Four times a day (QID) | ORAL | Status: AC | PRN
  Administered 2017-08-31: 5 mg via ORAL

## 2017-08-31 MED ORDER — SUGAMMADEX SODIUM 200 MG/2ML IV SOLN
INTRAVENOUS | Status: DC | PRN
  Administered 2017-08-31: 200 mg via INTRAVENOUS

## 2017-08-31 MED ORDER — BUPIVACAINE-EPINEPHRINE (PF) 0.5% -1:200000 IJ SOLN
INTRAMUSCULAR | Status: DC | PRN
  Administered 2017-08-31: 30 mL

## 2017-08-31 MED ORDER — FENTANYL CITRATE (PF) 100 MCG/2ML IJ SOLN
INTRAMUSCULAR | Status: AC
  Administered 2017-08-31: 25 ug via INTRAVENOUS
  Filled 2017-08-31: qty 2

## 2017-08-31 MED ORDER — CHLORHEXIDINE GLUCONATE CLOTH 2 % EX PADS: 6.0000 | MEDICATED_PAD | Freq: Once | CUTANEOUS | Status: DC

## 2017-08-31 MED ORDER — GABAPENTIN 300 MG PO CAPS
300.0000 mg | ORAL_CAPSULE | ORAL | Status: AC
  Administered 2017-08-31: 300 mg via ORAL

## 2017-08-31 MED ORDER — BUPIVACAINE-EPINEPHRINE (PF) 0.25% -1:200000 IJ SOLN
INTRAMUSCULAR | Status: AC
  Filled 2017-08-31: qty 30

## 2017-08-31 MED ORDER — PROPOFOL 10 MG/ML IV BOLUS
INTRAVENOUS | Status: DC | PRN
Start: 2017-08-31 — End: 2017-08-31
  Administered 2017-08-31: 120 mg via INTRAVENOUS

## 2017-08-31 MED ORDER — ROCURONIUM BROMIDE 50 MG/5ML IV SOLN
INTRAVENOUS | Status: AC
  Filled 2017-08-31: qty 1

## 2017-08-31 MED ORDER — EPHEDRINE SULFATE 50 MG/ML IJ SOLN
INTRAMUSCULAR | Status: DC | PRN
  Administered 2017-08-31 (×2): 5 mg via INTRAVENOUS
  Administered 2017-08-31: 10 mg via INTRAVENOUS

## 2017-08-31 MED ORDER — DEXAMETHASONE SODIUM PHOSPHATE 10 MG/ML IJ SOLN
INTRAMUSCULAR | Status: DC | PRN
  Administered 2017-08-31: 5 mg via INTRAVENOUS

## 2017-08-31 MED ORDER — FENTANYL CITRATE (PF) 100 MCG/2ML IJ SOLN
INTRAMUSCULAR | Status: DC | PRN
  Administered 2017-08-31: 50 ug via INTRAVENOUS
  Administered 2017-08-31: 100 ug via INTRAVENOUS
  Administered 2017-08-31: 50 ug via INTRAVENOUS

## 2017-08-31 MED ORDER — ACETAMINOPHEN 10 MG/ML IV SOLN
INTRAVENOUS | Status: AC
  Filled 2017-08-31: qty 100

## 2017-08-31 MED ORDER — MIDAZOLAM HCL 2 MG/2ML IJ SOLN
INTRAMUSCULAR | Status: DC | PRN
  Administered 2017-08-31: 2 mg via INTRAVENOUS

## 2017-08-31 MED ORDER — SUGAMMADEX SODIUM 200 MG/2ML IV SOLN
INTRAVENOUS | Status: AC
  Filled 2017-08-31: qty 2

## 2017-08-31 MED ORDER — FAMOTIDINE 20 MG PO TABS
20.0000 mg | ORAL_TABLET | Freq: Once | ORAL | Status: AC
  Administered 2017-08-31: 20 mg via ORAL

## 2017-08-31 MED ORDER — ONDANSETRON HCL 4 MG/2ML IJ SOLN
INTRAMUSCULAR | Status: AC
  Administered 2017-08-31: 4 mg via INTRAVENOUS
  Filled 2017-08-31: qty 2

## 2017-08-31 MED ORDER — FENTANYL CITRATE (PF) 250 MCG/5ML IJ SOLN
INTRAMUSCULAR | Status: AC
Start: 2017-08-31 — End: ?
  Filled 2017-08-31: qty 5

## 2017-08-31 MED ORDER — ALVIMOPAN 12 MG PO CAPS
ORAL_CAPSULE | ORAL | Status: AC
  Administered 2017-08-31: 12 mg via ORAL
  Filled 2017-08-31: qty 1

## 2017-08-31 MED ORDER — ACETAMINOPHEN 500 MG PO TABS
1000.0000 mg | ORAL_TABLET | ORAL | Status: AC
  Administered 2017-08-31: 1000 mg via ORAL

## 2017-08-31 MED ORDER — OXYCODONE HCL 5 MG PO TABS
ORAL_TABLET | ORAL | Status: AC
  Filled 2017-08-31: qty 1

## 2017-08-31 MED ORDER — OXYCODONE HCL 5 MG PO TABS: 5.0000 mg | ORAL_TABLET | Freq: Four times a day (QID) | ORAL | 0 refills | Status: DC | PRN

## 2017-08-31 MED ORDER — PROPOFOL 10 MG/ML IV BOLUS
INTRAVENOUS | Status: AC
  Filled 2017-08-31: qty 20

## 2017-08-31 MED ORDER — FAMOTIDINE 20 MG PO TABS
ORAL_TABLET | ORAL | Status: AC
  Administered 2017-08-31: 20 mg via ORAL
  Filled 2017-08-31: qty 1

## 2017-08-31 MED ORDER — KETOROLAC TROMETHAMINE 30 MG/ML IJ SOLN
INTRAMUSCULAR | Status: AC
  Filled 2017-08-31: qty 1

## 2017-08-31 MED ORDER — FENTANYL CITRATE (PF) 100 MCG/2ML IJ SOLN
25.0000 ug | INTRAMUSCULAR | Status: DC | PRN
  Administered 2017-08-31 (×4): 25 ug via INTRAVENOUS

## 2017-08-31 MED ORDER — LIDOCAINE HCL (CARDIAC) PF 100 MG/5ML IV SOSY
PREFILLED_SYRINGE | INTRAVENOUS | Status: DC | PRN
  Administered 2017-08-31: 80 mg via INTRAVENOUS

## 2017-08-31 MED ORDER — SUCCINYLCHOLINE CHLORIDE 20 MG/ML IJ SOLN
INTRAMUSCULAR | Status: AC
  Filled 2017-08-31: qty 1

## 2017-08-31 MED ORDER — IBUPROFEN 600 MG PO TABS: 600.0000 mg | ORAL_TABLET | Freq: Three times a day (TID) | ORAL | 0 refills | Status: AC | PRN

## 2017-08-31 MED ORDER — DEXAMETHASONE SODIUM PHOSPHATE 10 MG/ML IJ SOLN
INTRAMUSCULAR | Status: AC
  Filled 2017-08-31: qty 1

## 2017-08-31 MED ORDER — SUCCINYLCHOLINE CHLORIDE 20 MG/ML IJ SOLN
INTRAMUSCULAR | Status: DC | PRN
Start: 2017-08-31 — End: 2017-08-31
  Administered 2017-08-31: 100 mg via INTRAVENOUS

## 2017-08-31 MED ORDER — ALVIMOPAN 12 MG PO CAPS
12.0000 mg | ORAL_CAPSULE | ORAL | Status: AC
  Administered 2017-08-31: 12 mg via ORAL

## 2017-08-31 SURGICAL SUPPLY — 54 items
BLADE SURG SZ10 CARB STEEL (BLADE) ×3 IMPLANT
CANISTER SUCT 1200ML W/VALVE (MISCELLANEOUS) ×3 IMPLANT
CHLORAPREP W/TINT 26ML (MISCELLANEOUS) ×3 IMPLANT
CUTTER FLEX LINEAR 45M (STAPLE) IMPLANT
DECANTER SPIKE VIAL GLASS SM (MISCELLANEOUS) IMPLANT
DERMABOND ADVANCED (GAUZE/BANDAGES/DRESSINGS) ×2
DERMABOND ADVANCED .7 DNX12 (GAUZE/BANDAGES/DRESSINGS) ×1 IMPLANT
ELECT CAUTERY BLADE 6.4 (BLADE) ×3 IMPLANT
ELECT REM PT RETURN 9FT ADLT (ELECTROSURGICAL) ×3
ELECTRODE REM PT RTRN 9FT ADLT (ELECTROSURGICAL) ×1 IMPLANT
GLOVE SURG SYN 7.0 (GLOVE) ×3 IMPLANT
GLOVE SURG SYN 7.5  E (GLOVE) ×2
GLOVE SURG SYN 7.5 E (GLOVE) ×1 IMPLANT
GOWN STRL REUS W/ TWL LRG LVL3 (GOWN DISPOSABLE) ×2 IMPLANT
GOWN STRL REUS W/TWL LRG LVL3 (GOWN DISPOSABLE) ×4
HANDLE YANKAUER SUCT BULB TIP (MISCELLANEOUS) ×3 IMPLANT
HOLDER FOLEY CATH W/STRAP (MISCELLANEOUS) IMPLANT
IRRIGATION STRYKERFLOW (MISCELLANEOUS) ×1 IMPLANT
IRRIGATOR STRYKERFLOW (MISCELLANEOUS) ×3
IV NS 1000ML (IV SOLUTION)
IV NS 1000ML BAXH (IV SOLUTION) IMPLANT
KIT TURNOVER KIT A (KITS) ×3 IMPLANT
LABEL OR SOLS (LABEL) ×3 IMPLANT
LIGASURE LAP MARYLAND 5MM 37CM (ELECTROSURGICAL) IMPLANT
NEEDLE HYPO 22GX1.5 SAFETY (NEEDLE) ×3 IMPLANT
NS IRRIG 1000ML POUR BTL (IV SOLUTION) ×3 IMPLANT
NS IRRIG 500ML POUR BTL (IV SOLUTION) ×3 IMPLANT
PACK COLON CLEAN CLOSURE (MISCELLANEOUS) IMPLANT
PACK LAP CHOLECYSTECTOMY (MISCELLANEOUS) ×3 IMPLANT
PENCIL ELECTRO HAND CTR (MISCELLANEOUS) ×3 IMPLANT
POUCH SPECIMEN RETRIEVAL 10MM (ENDOMECHANICALS) ×3 IMPLANT
RELOAD 45 VASCULAR/THIN (ENDOMECHANICALS) IMPLANT
RELOAD STAPLE TA45 3.5 REG BLU (ENDOMECHANICALS) ×9 IMPLANT
RETRACTOR WOUND ALXS 18CM MED (MISCELLANEOUS) IMPLANT
RTRCTR WOUND ALEXIS O 18CM MED (MISCELLANEOUS)
SCISSORS METZENBAUM CVD 33 (INSTRUMENTS) IMPLANT
SLEEVE ADV FIXATION 5X100MM (TROCAR) ×9 IMPLANT
SPONGE LAP 18X18 5 PK (GAUZE/BANDAGES/DRESSINGS) IMPLANT
STAPLER SKIN PROX 35W (STAPLE) IMPLANT
SUT MNCRL 3-0 UNDYED SH (SUTURE) ×1 IMPLANT
SUT MNCRL 4-0 (SUTURE) ×2
SUT MNCRL 4-0 27XMFL (SUTURE) ×1
SUT MONOCRYL 3-0 UNDYED (SUTURE) ×2
SUT PDS AB 1 TP1 96 (SUTURE) ×6 IMPLANT
SUT SILK 2-0 (SUTURE) ×3 IMPLANT
SUT SILK 3-0 (SUTURE) ×3 IMPLANT
SUT VICRYL 0 AB UR-6 (SUTURE) ×3 IMPLANT
SUTURE MNCRL 4-0 27XMF (SUTURE) ×1 IMPLANT
TRAY FOLEY W/METER SILVER 16FR (SET/KITS/TRAYS/PACK) ×3 IMPLANT
TROCAR BALLN GELPORT 12X130M (ENDOMECHANICALS) ×3 IMPLANT
TROCAR Z-THREAD FIOS 11X100 BL (TROCAR) IMPLANT
TROCAR Z-THREAD OPTICAL 5X100M (TROCAR) ×3 IMPLANT
TUBING INSUF HEATED (TUBING) ×3 IMPLANT
TUBING INSUFFLATION (TUBING) ×3 IMPLANT

## 2017-08-31 NOTE — Anesthesia Procedure Notes (Signed)
Procedure Name: Intubation Date/Time: 08/31/2017 10:06 AM Performed by: Terrence Dupont, CRNA Pre-anesthesia Checklist: Patient identified, Emergency Drugs available, Suction available, Patient being monitored and Timeout performed Patient Re-evaluated:Patient Re-evaluated prior to induction Oxygen Delivery Method: Circle system utilized Preoxygenation: Pre-oxygenation with 100% oxygen Induction Type: IV induction Ventilation: Mask ventilation without difficulty Laryngoscope Size: McGraph and 4 Grade View: Grade I Tube type: Oral Tube size: 7.0 mm Number of attempts: 1 Airway Equipment and Method: Stylet Placement Confirmation: ETT inserted through vocal cords under direct vision,  positive ETCO2 and breath sounds checked- equal and bilateral Secured at: 21 cm Dental Injury: Teeth and Oropharynx as per pre-operative assessment  Difficulty Due To: Difficulty was anticipated

## 2017-08-31 NOTE — Interval H&P Note (Signed)
History and Physical Interval Note:  08/31/2017 8:54 AM  Jaime Brock  has presented today for surgery, with the diagnosis of appendiceal mucocele  The various methods of treatment have been discussed with the patient and family. After consideration of risks, benefits and other options for treatment, the patient has consented to  Procedure(s): APPENDECTOMY LAPAROSCOPIC (N/A) with possible LAPAROSCOPIC RIGHT COLECTOMY (N/A) as a surgical intervention .  The patient's history has been reviewed, patient examined, no change in status, stable for surgery.  I have reviewed the patient's chart and labs.  Questions were answered to the patient's satisfaction.     Jaime Brock

## 2017-08-31 NOTE — Anesthesia Preprocedure Evaluation (Signed)
Anesthesia Evaluation  Patient identified by MRN, date of birth, ID band Patient awake    Reviewed: Allergy & Precautions, NPO status , Patient's Chart, lab work & pertinent test results, reviewed documented beta blocker date and time   Airway Mallampati: III  TM Distance: >3 FB     Dental  (+) Chipped   Pulmonary           Cardiovascular      Neuro/Psych    GI/Hepatic   Endo/Other    Renal/GU      Musculoskeletal   Abdominal   Peds  Hematology   Anesthesia Other Findings Obese.  Reproductive/Obstetrics                             Anesthesia Physical Anesthesia Plan  ASA: III  Anesthesia Plan: General   Post-op Pain Management:    Induction: Intravenous  PONV Risk Score and Plan:   Airway Management Planned: Oral ETT  Additional Equipment:   Intra-op Plan:   Post-operative Plan:   Informed Consent: I have reviewed the patients History and Physical, chart, labs and discussed the procedure including the risks, benefits and alternatives for the proposed anesthesia with the patient or authorized representative who has indicated his/her understanding and acceptance.     Plan Discussed with: CRNA  Anesthesia Plan Comments:         Anesthesia Quick Evaluation  

## 2017-08-31 NOTE — Progress Notes (Signed)
Pt has 3 port sites covered with dermabond   Clean and dry

## 2017-08-31 NOTE — Anesthesia Post-op Follow-up Note (Signed)
Anesthesia QCDR form completed.        

## 2017-08-31 NOTE — Progress Notes (Signed)
Interpreter into see and give instructions    Family states understanding

## 2017-08-31 NOTE — Op Note (Signed)
  Procedure Date:  08/31/2017  Pre-operative Diagnosis:  Appendiceal orifice mass  Post-operative Diagnosis:  Appendiceal orifice mass  Procedure:  Laparoscopic appendectomy with partial cecectomy  Surgeon:  Melvyn Neth, MD  Anesthesia:  General endotracheal  Estimated Blood Loss:  10 ml  Specimens:  Appendix and cecal cap  Complications:  None  Indications for Procedure:  This is a 52 y.o. female who presents with diagnosis of a submucosal appendiceal orifice mass noted on screening colonoscopy and CT scan.  Surgical options of laparoscopic appendectomy vs right colectomy depending on margins and space were discussed with the patient as well the risks of bleeding, infection, and injury to surrounding structures.  She was willing to proceed.  Description of Procedure: The patient was correctly identified in the preoperative area and brought into the operating room.  The patient was placed supine with VTE prophylaxis in place.  Appropriate time-outs were performed.  Anesthesia was induced and the patient was intubated.  Foley catheter was placed.  Appropriate antibiotics were infused.  The abdomen was prepped and draped in a sterile fashion. An infraumbilical incision was made. A cutdown technique was used to enter the abdominal cavity without injury, and a Hasson trocar was inserted.  Pneumoperitoneum was obtained with appropriate opening pressures.  Two 5-mm ports were placed in the suprapubic and left lateral positions under direct visualization.  The right lower quadrant was inspected and the appendix was identified.  The cecum was mobilized off the lateral abdominal wall and the appendix was carefully dissected using blunt and EndoSeal for the mesoappendix.  Dissection was carried down to the edge of the cecum with the terminal ileum junction. Three blue loads of the Endo GIA stapler were used to resect the appendix with the proximal cecum, paying careful attention that the small  bowel was not being included.  The appendix and cecum were placed in an Endocatch bag and brought out through the umbilical incision.  There were two spots on the staple line that had mild oozing and these were controlled with cautery. The right lower quadrant was then inspected again revealing an intact staple line, no bleeding, and no bowel injury.  The area was thoroughly irrigated.  The 5 mm ports were removed under direct visualization and the Hasson trocar was removed.  The fascial opening was closed using 0 vicryl suture.  Local anesthetic was infused in all incisions and the incisions were closed with 4-0 Monocryl.  The wounds were cleaned and sealed with DermaBond.  Foley catheter was removed and the patient was emerged from anesthesia and extubated and brought to the recovery room for further management.  The patient tolerated the procedure well and all counts were correct at the end of the case.   Melvyn Neth, MD

## 2017-08-31 NOTE — Anesthesia Postprocedure Evaluation (Signed)
Anesthesia Post Note  Patient: Jaime Brock  Procedure(s) Performed: APPENDECTOMY LAPAROSCOPIC (N/A )  Patient location during evaluation: PACU Anesthesia Type: General Level of consciousness: awake and alert and oriented Pain management: pain level controlled Vital Signs Assessment: post-procedure vital signs reviewed and stable Respiratory status: spontaneous breathing Cardiovascular status: blood pressure returned to baseline Anesthetic complications: no     Last Vitals:  Vitals:   08/31/17 1305 08/31/17 1357  BP: 140/82 119/71  Pulse: 79 78  Resp: 16   Temp: (!) 36.2 C   SpO2: 97% 96%    Last Pain:  Vitals:   08/31/17 1305  TempSrc: Temporal  PainSc: 4                  Danh Bayus

## 2017-08-31 NOTE — Transfer of Care (Signed)
Immediate Anesthesia Transfer of Care Note  Patient: Jaime Brock  Procedure(s) Performed: APPENDECTOMY LAPAROSCOPIC (N/A )  Patient Location: PACU  Anesthesia Type:General  Level of Consciousness: sedated and responds to stimulation  Airway & Oxygen Therapy: Patient Spontanous Breathing and Patient connected to face mask  Post-op Assessment: Report given to RN  Post vital signs: Reviewed and stable  Last Vitals:  Vitals Value Taken Time  BP 117/64 08/31/2017 11:51 AM  Temp 97.9    Pulse 94 08/31/2017 11:51 AM  Resp 20 08/31/2017 11:51 AM  SpO2 100 % 08/31/2017 11:51 AM  Vitals shown include unvalidated device data.  Last Pain:  Vitals:   08/31/17 0847  TempSrc: Temporal  PainSc:          Complications: No apparent anesthesia complications

## 2017-09-01 ENCOUNTER — Encounter: Payer: Self-pay | Admitting: Surgery

## 2017-09-06 ENCOUNTER — Telehealth: Payer: Self-pay

## 2017-09-06 LAB — SURGICAL PATHOLOGY

## 2017-09-06 NOTE — Telephone Encounter (Signed)
Disability paperwork completed and faxed. Placed in scan folder.

## 2017-09-14 ENCOUNTER — Encounter: Payer: Self-pay | Admitting: Surgery

## 2017-09-15 ENCOUNTER — Ambulatory Visit (INDEPENDENT_AMBULATORY_CARE_PROVIDER_SITE_OTHER): Payer: Commercial Managed Care - PPO | Admitting: Surgery

## 2017-09-15 ENCOUNTER — Encounter: Payer: Self-pay | Admitting: Surgery

## 2017-09-15 ENCOUNTER — Telehealth: Payer: Self-pay

## 2017-09-15 VITALS — BP 119/75 | HR 64 | Temp 97.8°F | Wt 206.0 lb

## 2017-09-15 DIAGNOSIS — D373 Neoplasm of uncertain behavior of appendix: Secondary | ICD-10-CM

## 2017-09-15 NOTE — Progress Notes (Signed)
Surgical Clinic Progress/Follow-up Note   HPI:  52 y.o. Female presents to clinic for post-op follow-up evaluation s/p laparoscopic appendectomy for appendiceal orifice mass identified on screening colonoscopy. Patient reports mild peri-umbilical peri-incisional pain and has been tolerating regular diet with +flatus, but she complains her BM's, although once daily, have been hard and require straining even since no longer requiring any narcotic pain medication. She otherwise denies N/V, fever/chills, CP, or SOB.  Review of Systems:  Constitutional: denies any other weight loss, fever, chills, or sweats  Eyes: denies any other vision changes, history of eye injury  ENT: denies sore throat, hearing problems  Respiratory: denies shortness of breath, wheezing  Cardiovascular: denies chest pain, palpitations  Gastrointestinal: abdominal pain, N/V, and bowel function as per HPI Musculoskeletal: denies any other joint pains or cramps  Skin: Denies any other rashes or skin discolorations  Neurological: denies any other headache, dizziness, weakness  Psychiatric: denies any other depression, anxiety  All other review of systems: otherwise negative   Vital Signs:  BP 119/75   Pulse 64   Temp 97.8 F (36.6 C) (Oral)   Wt 206 lb (93.4 kg)   LMP 08/17/2017 (Approximate) Comment: Pt denies any chance of preg, pt does not wnat to take preg test.   BMI 41.61 kg/m    Physical Exam:  Constitutional:  -- Overweight body habitus  -- Awake, alert, and oriented x3  Eyes:  -- Pupils equally round and reactive to light  -- No scleral icterus  Ear, nose, throat:  -- No jugular venous distension  -- No nasal drainage, bleeding Pulmonary:  -- No crackles -- Equal breath sounds bilaterally -- Breathing non-labored at rest Cardiovascular:  -- S1, S2 present  -- No pericardial rubs  Gastrointestinal:  -- Soft, nontender, non-distended, no guarding/rebound -- Incisions well-approximated without  surrounding erythema or drainage -- No abdominal masses appreciated, pulsatile or otherwise  Musculoskeletal / Integumentary:  -- Wounds or skin discoloration: None appreciated except post-surgical wounds as described above (GI) -- Extremities: B/L UE and LE FROM, hands and feet warm, no edema  Neurologic:  -- Motor function: intact and symmetric  -- Sensation: intact and symmetric   Surgical Pathology (08/31/2017):  APPENDECTOMY AND PARTIAL CECECTOMY:  - LOW-GRADE APPENDICEAL MUCINOUS NEOPLASM (LAMN).  - SESSILE SERRATED ADENOMA.  - SURGICAL MARGINS APPEAR NEGATIVE.   Tumor Site: Appendix  Tumor Size: Greatest dimension: Approximately 0.9 cm  Histologic Type: Low-grade appendiceal mucinous neoplasm (LAMN)  Histologic Grade: G1  Tumor extension: LAMN confined by the muscularis propria  Margins: Uninvolved by appendiceal mucinous neoplasm  Lymphovascular Invasion: Not identified  Tumor deposits: Not identified  Pathologic Stage Classification (pTNM, AJCC 8th Edition): pTis  Assessment:  52 y.o. yo Female with a problem list including...  Patient Active Problem List   Diagnosis Date Noted  . 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 Submucosal colonic lesion 07/07/2017  . 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  Plan:              - advance diet as tolerated  - discussed patient and pathology with patient and with Dr. Hampton Abbot (operating surgeon), who advises no further intervention indicated except follow-up colonoscopy  - maintain adequate hydration and high fiber diet with once daily colace to reduce constipation, may discontinue if BM's too loose and Miralax prn if no daily BM             -  okay to submerge incisions under water (baths, swimming) prn             - gradually resume all activities without  restrictions over next 2 weeks             - apply sunblock particularly to incisions with sun exposure to reduce pigmentation of scars             - return to clinic as needed, instructed to call office if any questions or concerns  - will confirm follow-up with GI for colonoscopy  All of the above recommendations were discussed with the patient via certified Spanish-English translator, and all of patient's questions were answered to her expressed satisfaction.  -- Marilynne Drivers Rosana Hoes, MD, [place]: [facility] - Partnering for exceptional care. Office: [phone]

## 2017-09-15 NOTE — Patient Instructions (Signed)
Por favor llamenos si tiene alguna preguntas.  Continue viendo a su Systems developer y doctora de cabezera para su cuidado medico.  Para el estrenimiento, quiero que tome una tableta de Colace al dia y Miralax 17 Gramos cuando no pueda hacer del bano.  Continue tomando [place] y trate de comer menos galletas y rosquillas (donuts).

## 2017-09-15 NOTE — Telephone Encounter (Signed)
I reached out to Driscilla Grammes (Dr. Verlin Grills CMA) to let her know to please reach out to the patient and to follow up with her for her colonoscopy.

## 2017-09-16 ENCOUNTER — Encounter: Payer: Self-pay | Admitting: Surgery

## 2017-09-16 DIAGNOSIS — D373 Neoplasm of uncertain behavior of appendix: Secondary | ICD-10-CM

## 2017-09-16 HISTORY — DX: Neoplasm of uncertain behavior of appendix: D37.3

## 2017-10-28 ENCOUNTER — Ambulatory Visit: Payer: Commercial Managed Care - PPO | Admitting: Gastroenterology

## 2017-10-28 ENCOUNTER — Encounter: Payer: Self-pay | Admitting: Gastroenterology

## 2017-10-28 ENCOUNTER — Other Ambulatory Visit: Payer: Self-pay

## 2017-10-28 VITALS — BP 117/74 | HR 79 | Resp 17 | Ht 59.0 in | Wt 209.4 lb

## 2017-10-28 DIAGNOSIS — D126 Benign neoplasm of colon, unspecified: Secondary | ICD-10-CM

## 2017-10-28 DIAGNOSIS — Z86010 Personal history of colonic polyps: Secondary | ICD-10-CM | POA: Diagnosis not present

## 2017-10-28 DIAGNOSIS — K5909 Other constipation: Secondary | ICD-10-CM | POA: Diagnosis not present

## 2017-10-28 DIAGNOSIS — Z9049 Acquired absence of other specified parts of digestive tract: Secondary | ICD-10-CM | POA: Diagnosis not present

## 2017-10-28 MED ORDER — HYDROCORTISONE ACETATE 25 MG RE SUPP: 25.0000 mg | Freq: Two times a day (BID) | RECTAL | 0 refills | Status: AC

## 2017-10-28 NOTE — Progress Notes (Signed)
Cephas Darby, MD [address]  [place]  [place], [place] [address]  Main: [phone]  Fax: [phone]    Gastroenterology Consultation  Referring Provider:     [facility] [place] Physician:  [facility], [place] Primary Gastroenterologist:  Dr. Cephas Darby Reason for Consultation:     Follow-up of appendiceal mucinous neoplasm        HPI:   Jaime Brock is a 52 y.o. female referred by Dr. Domingo Madeira, [place]  for consultation & management of follow-up of appendiceal mucinous neoplasm. I initially met the patient in 06/2017 for screening colonoscopy and found to have a 10 mm polypoid lesion in the appendiceal orifice which was submucosal. I referred her to general surgery for right hemicolectomy which she underwent in 08/2017. The surgical pathology turned out to be low-grade appendiceal mucinous neoplasm with surgical margins negative. She did not have any complications postoperatively.  She reports new onset of constipation since surgery for which she tried Colace, Dulcolax which she did not provide much benefit. She also reports that her hemorrhoids are inflamed and has been taking ibuprofen for the rectal discomfort.  NSAIDs: ibuprofen  Antiplts/Anticoagulants/Anti thrombotics: none  GI Procedures: Colonoscopy 07/07/2017  A 10 mm polypoid lesion was found appendiceal orifice. The lesion was polypoid and submucosal. No bleeding was present. A 7 mm polyp was found in the descending colon. The polyp was semi-pedunculated. The polyp was removed with a hot snare. Resection and retrieval were complete. DIAGNOSIS:  A. COLON POLYP, DESCENDING; HOT SNARE:  - TUBULAR ADENOMA.  - NEGATIVE FOR HIGH-GRADE DYSPLASIA AND MALIGNANCY. DIAGNOSIS:  A. APPENDIX AND CECUM; PARTIAL CECECTOMY AND APPENDECTOMY:  - LOW-GRADE APPENDICEAL MUCINOUS NEOPLASM (LAMN).  - SESSILE SERRATED ADENOMA.  - SURGICAL MARGINS APPEAR  NEGATIVE.    Past Medical History:  Diagnosis Date  . 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 CHOLECYSTECTOMY    . COLONOSCOPY WITH PROPOFOL N/A 07/07/2017   Procedure: COLONOSCOPY WITH PROPOFOL;  Surgeon: Lin Landsman, MD;  Location: [facility] ENDOSCOPY;  Service: Gastroenterology;  Laterality: N/A;  . LAPAROSCOPIC APPENDECTOMY N/A 08/31/2017   Procedure: APPENDECTOMY LAPAROSCOPIC;  Surgeon: Olean Ree, MD;  Location: [facility] ORS;  Service: General;  Laterality: N/A;  . TUBAL LIGATION      Current Outpatient Medications:  .  HOMEOPATHIC PRODUCTS EX, Take 5 mLs by mouth every 4 (four) hours as needed (FOR COUGH). BRONCOLIN HONEY SYRUP WITH NATURAL PLANT EXTRACTS, Disp: , Rfl:  .  ibuprofen (ADVIL,MOTRIN) 600 MG tablet, Take 1 tablet (600 mg total) by mouth every 8 (eight) hours as needed for fever, mild pain or moderate pain., Disp: 30 tablet, Rfl: 0 .  PROCTOSOL HC 2.5 % rectal cream, APPLY TO AFFECTED AREA UP TO 3 TO 4 TIMES DAILY FOR HEMORRHOIDS (SPANISH INSTRUC), Disp: , Rfl: 1 .  hydrocortisone (ANUSOL-HC) 25 MG suppository, Place 1 suppository (25 mg total) rectally 2 (two) times daily for 14 days., Disp: 28 suppository, Rfl: 0 .  Pseudoeph-Doxylamine-DM-APAP (NYQUIL PO), Take 15-30 mLs by mouth at bedtime as needed (FOR COUGH AT NIGHT)., Disp: , Rfl:   Family History  Problem Relation Age of Onset  . Diabetes Mother   . Gallbladder disease Mother   . Hypertension Mother   . Breast cancer Neg Hx   . Cancer Neg Hx      Social History   Tobacco Use  . Smoking  status: Never Smoker  . Smokeless tobacco: Never Used  Substance Use Topics  . Alcohol use: No    Frequency: Never  . Drug use: No    Allergies as of 10/28/2017  . (No Known Allergies)    Review of Systems:    All systems reviewed and negative except where noted in HPI.   Physical Exam:  BP 117/74 (BP Location: Left Arm, Patient Position:  Sitting, Cuff Size: Large)   Pulse 79   Resp 17   Ht _0  (1.499 m)   Wt 209 lb 6.4 oz (95 kg)   LMP 08/17/2017 (Approximate) Comment: Pt denies any chance of preg, pt does not wnat to take preg test.   BMI 42.29 kg/m  Patient's last menstrual period was 08/17/2017 (approximate).  General:   Alert, morbidly obese, short stature, Well-developed, well-nourished, pleasant and cooperative in NAD Head:  Normocephalic and atraumatic. Eyes:  Sclera clear, no icterus.   Conjunctiva pink. Ears:  Normal auditory acuity. Nose:  No deformity, discharge, or lesions. Mouth:  No deformity or lesions,oropharynx pink & moist. Neck:  Supple; no masses or thyromegaly. Lungs:  Respirations even and unlabored.  Clear throughout to auscultation.   No wheezes, crackles, or rhonchi. No acute distress. Heart:  Regular rate and rhythm; no murmurs, clicks, rubs, or gallops. Abdomen:  Normal bowel sounds. Soft, non-tender and non-distended without masses, hepatosplenomegaly or hernias noted.  No guarding or rebound tenderness.   Rectal: Not performed Msk:  Symmetrical without gross deformities. Good, equal movement & strength bilaterally. Pulses:  Normal pulses noted. Extremities:  No clubbing or edema.  No cyanosis. Neurologic:  Alert and oriented x3;  grossly normal neurologically. Skin:  Intact without significant lesions or rashes. No jaundice. Lymph Nodes:  No significant cervical adenopathy. Psych:  Alert and cooperative. Normal mood and affect.  Imaging Studies: reviewed  Assessment and Plan:   Jaime Brock is a 52 y.o. 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

## 2017-10-28 NOTE — Patient Instructions (Addendum)
1. Stop ibuprofen 2. Apply hydrocortisone cream on glycerine suppository twice daily 3. High fiber diet and start miralax 17gm 1-2 times daily 4. Fiber supplements 5. F/u in 12months  Please call our office to speak with my nurse Barnie Alderman [phone] during business hours from 8am to 4pm if you have any questions/concerns. During after hours, you will be redirected to on call GI physician. For any emergency please call 911 or go the nearest emergency room.    Cephas Darby, MD [address]  [place]  [place], [place] [address]  Main: [phone]  Fax: [phone]     Estreimiento, en adultos Constipation, Adult Estreimiento significa que una persona defeca en una semana menos que lo normal, tiene dificultad para defecar, o las heces son secas, duras, o ms grandes que lo normal. El estreimiento podra estar provocado por una enfermedad preexistente. Puede empeorar con la edad si una persona toma ciertos medicamentos y no toma suficiente lquido. Siga estas indicaciones en su casa: Qu debe comer y beber   Consuma alimentos con alto contenido de [place], como frutas y verduras frescas, cereales integrales y frijoles.  Limite los alimentos ricos en grasas y con bajo contenido de fibra, o muy procesados, como las papas fritas, [place], [place], dulces y refrescos.  Beba suficiente lquido para Consulting civil engineer orina clara o de color amarillo plido. Instrucciones generales  Haga actividad fsica habitualmente o como se lo haya indicado el mdico.  Vaya al bao cuando sienta la necesidad de ir. No se aguante las ganas.  Tome los medicamentos de venta libre y los recetados solamente como se lo haya indicado el mdico. Estos incluyen los suplementos de [place].  Practique ejercicios de rehabilitacin del suelo plvico, como la respiracin profunda mientras relaja la parte inferior del abdomen y la relajacin del suelo plvico mientras defeca.  Controle su afeccin para  Actuary cambio.  Concurra a todas las visitas de seguimiento como se lo haya indicado el mdico. Esto es importante. Comunquese con un mdico si:  Su dolor empeora.  Tiene fiebre.  No defeca despus de 4das.  Vomita.  No tiene hambre.  Pierde peso.  Tiene una hemorragia en el ano.  Las heces son delgadas como un lpiz. Solicite ayuda de inmediato si:  Tiene fiebre y los sntomas empeoran repentinamente.  Observa que se filtran heces o hay sangre en las heces.  Tiene el abdomen distendido.  Siente un dolor intenso en el abdomen.  Se siente mareado o se desmaya. Esta informacin no tiene Marine scientist el consejo del mdico. Asegrese de hacerle al mdico cualquier pregunta que tenga. Document Released: 05/16/2007 Document Revised: 08/16/2016 Document Reviewed: 10/15/2015 Elsevier Interactive Patient Education  2018 [place] en los adultos Constipation, Adult Se llama estreimiento cuando:  Tiene deposiciones (defeca) una menor cantidad de veces a la semana de lo normal.  Tiene dificultad para defecar.  Las heces son secas y duras o son ms grandes que lo normal.  Siga estas indicaciones en su casa: Comida y bebida   Consuma alimentos con alto contenido de [place], por ejemplo: ? Lambert Mody y verduras frescas. ? Cereales integrales. ? Frijoles.  Consuma una menor cantidad de alimentos ricos en grasas, con bajo contenido de [place] o excesivamente procesados, como: ? Papas fritas. ? Hamburguesas. ? Galletas. ? Caramelos. ? Gaseosas.  Beba suficiente lquido para mantener el pis (orina) claro o de color amarillo plido. Instrucciones generales  Haga actividad fsica con regularidad o segn las indicaciones del  mdicoDeloria Lair al bao cuando sienta la necesidad de defecar. No se aguante las ganas.  Tome los medicamentos de venta libre y los recetados solamente como se lo haya indicado el mdico. Estos incluyen los  suplementos de [place].  Realice ejercicios de reentrenamiento del suelo plvico, como: ? Respirar profundamente mientras relaja la parte inferior del vientre (abdomen). ? Relajar el suelo plvico mientras defeca.  Controle su afeccin para ver si hay cambios.  Concurra a todas las visitas de control como se lo haya indicado el mdico. Esto es importante. Comunquese con un mdico si:  Siente un dolor que empeora.  Tiene fiebre.  No ha defecado por 4das.  Vomita.  No tiene hambre.  Pierde peso.  Tiene una hemorragia en el ano.  Las deposiciones Media planner) son delgadas como un lpiz. Solicite ayuda de inmediato si:  Jaclynn Guarneri, y los sntomas empeoran de repente.  Tiene prdida de materia fecal u observa [employer]AC[employer].  Siente el vientre ms duro o ms grande de lo normal (est hinchado).  Siente un dolor muy intenso en el vientre.  Se siente mareado o se desmaya. Esta informacin no tiene Marine scientist el consejo del mdico. Asegrese de hacerle al mdico cualquier pregunta que tenga. Document Released: 05/29/2010 Document Revised: 07/28/2016 Document Reviewed: 10/15/2015 Elsevier Interactive Patient Education  2018 [place] rica en fibra High-Fiber Diet [place], tambin llamada fibra dietaria, es un tipo de carbohidrato que se encuentra en las frutas, las verduras, los cereales integrales y los frijoles. Una dieta rica en fibra puede tener muchos beneficios para la salud. El mdico puede recomendar una dieta rica en fibra para ayudar a:  Contractor. La fibra puede hacer que defeque con ms frecuencia.  Disminuir el nivel de colesterol.  Americus hemorroides, la diverticulosis no complicada o el sndrome de colon irritable.  Evitar comer en exceso como parte de un plan para bajar de peso.  Evitar la enfermedad cardaca, la diabetes tipo 2 y ciertos cnceres.  En qu consiste el plan? El consumo diario recomendado de  fibra incluye lo siguiente:  38gramos para los hombres menores de 68 aos.  30gramos para los hombres mayores de 50 aos.  25gramos para las mujeres menores de 50 aos.  21gramos para las Cendant Corporation de 50 aos.  Puede lograr el consumo diario recomendado de fibra si come una variedad de frutas, verduras, cereales y frijoles. El mdico tambin puede recomendar un suplemento de fibra si no es posible obtener suficiente fibra a travs de la dieta. Qu debo saber acerca de la dieta rica en fibra?  La eficacia de los suplementos de [place] no ha sido estudiada ampliamente, de modo que es mejor obtener fibra directamente de los alimentos.  Verifique siempre el contenido de fibra en la etiqueta de informacin nutricional de los alimentos preenvasados. Busque alimentos que contengan al menos 5gramos de fibra por porcin.  Consulte a un nutricionista si tiene preguntas sobre algunos alimentos especficos relacionados con su enfermedad, especialmente si estos alimentos no se mencionan a continuacin.  Aumente el consumo diario de fibra en forma gradual. Aumentar demasiado rpido el consumo de fibra dietaria puede provocar distensin abdominal, clicos o gases.  Beber abundante agua. El [place] a Economist. Qu alimentos puedo comer? Cereales Panes integrales. Cereal multigrano. Avena. Arroz integral. Dwyane Luo. Trigo burgol. Mijo. Magdalenas de salvado. Palomitas de maz. Galletas de centeno. Verduras Batatas. [place]. Col rizada. Alcachofas. Repollo. Brcoli. Guisantes. Zanahorias. [place]. Lambert Mody  BayasNadeen Landau. Manzanas. Naranjas. Aguacates. Ciruelas y pasas. Higos secos. Carnes y otras fuentes de protenas Frijoles blancos, colorados, pintos y porotos de soja. Guisantes secos. Lentejas. Frutos secos y semillas. Lcteos Yogur fortificado con Pharmacist, hospital. Bebidas Leche de soja fortificada con Fredderick Phenix. Jugo de naranja fortificado con Fredderick Phenix. Otros Barras de [place]. Es posible que  los productos que se enumeran ms arriba no sean una lista completa de las bebidas o los alimentos recomendados. Comunquese con el nutricionista para conocer ms opciones. Qu alimentos no se recomiendan? Cereales Pan blanco. Pastas hechas con Letitia Neri. Arroz blanco. Verduras Papas fritas. Verduras enlatadas. Verduras muy cocidas. Frutas Jugo de frutas. Frutas cocidas coladas. Carnes y otras fuentes de protenas Cortes de carne con alto contenido de Lobbyist. Aves o pescados fritos. Lcteos Leche. Yogur. Queso crema. [employer]. Bebidas Gaseosas. Otros Tortas y pasteles. [place] y aceites. Es posible que los productos que se enumeran ms arriba no sean una lista completa de los alimentos y las bebidas que se Higher education careers adviser. Comunquese con el nutricionista para obtener ms informacin. Cules son algunos consejos para incluir alimentos ricos en fibra en la dieta?  Consuma una gran variedad de alimentos ricos en fibra.  Asegrese de que la mitad de todos los cereales consumidos cada da sean cereales integrales.  Reemplace los panes y cereales hechos de harina refinada o harina blanca por panes y cereales integrales.  Reemplace el arroz blanco por arroz integral, trigo burgol o mijo.  Comience Games developer con un desayuno rico en [place], como un cereal que contenga al menos 5gramos de fibra por porcin.  Use guisantes en lugar de carne en las sopas, ensaladas o pastas.  Coma refrigerios ricos en fibra, como frutos rojos, verduras crudas, frutos secos o palomitas de maz. Esta informacin no tiene Marine scientist el consejo del mdico. Asegrese de hacerle al mdico cualquier pregunta que tenga. Document Released: 04/26/2005 Document Revised: 09/01/2016 Document Reviewed: 10/09/2013 Elsevier Interactive Patient Education  [employer].

## 2017-11-18 ENCOUNTER — Encounter: Payer: Self-pay | Admitting: Emergency Medicine

## 2017-11-18 ENCOUNTER — Emergency Department: Payer: Commercial Managed Care - PPO

## 2017-11-18 ENCOUNTER — Emergency Department
Admission: EM | Admit: 2017-11-18 | Discharge: 2017-11-18 | Disposition: A | Discharge status: Home / Self Care | Payer: Commercial Managed Care - PPO | Source: Home / Self Care | Attending: Emergency Medicine | Admitting: Emergency Medicine

## 2017-11-18 ENCOUNTER — Other Ambulatory Visit: Payer: Self-pay

## 2017-11-18 DIAGNOSIS — R7881 Bacteremia: Secondary | ICD-10-CM | POA: Diagnosis not present

## 2017-11-18 DIAGNOSIS — N10 Acute tubulo-interstitial nephritis: Secondary | ICD-10-CM | POA: Insufficient documentation

## 2017-11-18 DIAGNOSIS — N12 Tubulo-interstitial nephritis, not specified as acute or chronic: Secondary | ICD-10-CM

## 2017-11-18 DIAGNOSIS — A4151 Sepsis due to Escherichia coli [E. coli]: Secondary | ICD-10-CM | POA: Diagnosis not present

## 2017-11-18 LAB — CBC WITH DIFFERENTIAL/PLATELET
Basophils Absolute: 0 10*3/uL (ref 0–0.1)
Basophils Relative: 0 %
EOS ABS: 0 10*3/uL (ref 0–0.7)
EOS PCT: 1 %
HCT: 37 % (ref 35.0–47.0)
Hemoglobin: 12.7 g/dL (ref 12.0–16.0)
LYMPHS ABS: 0.9 10*3/uL — AB (ref 1.0–3.6)
Lymphocytes Relative: 8 %
MCH: 30 pg (ref 26.0–34.0)
MCHC: 34.3 g/dL (ref 32.0–36.0)
MCV: 87.5 fL (ref 80.0–100.0)
MONOS PCT: 2 %
Monocytes Absolute: 0.2 10*3/uL (ref 0.2–0.9)
NEUTROS PCT: 89 %
Neutro Abs: 9.7 10*3/uL — ABNORMAL HIGH (ref 1.4–6.5)
PLATELETS: 202 10*3/uL (ref 150–440)
RBC: 4.23 MIL/uL (ref 3.80–5.20)
RDW: 15.1 % — AB (ref 11.5–14.5)
WBC: 10.9 10*3/uL (ref 3.6–11.0)

## 2017-11-18 LAB — COMPREHENSIVE METABOLIC PANEL
ALT: 16 U/L (ref 0–44)
ANION GAP: 9 (ref 5–15)
AST: 28 U/L (ref 15–41)
Albumin: 3.7 g/dL (ref 3.5–5.0)
Alkaline Phosphatase: 90 U/L (ref 38–126)
BUN: 20 mg/dL (ref 6–20)
CHLORIDE: 110 mmol/L (ref 98–111)
CO2: 22 mmol/L (ref 22–32)
Calcium: 9 mg/dL (ref 8.9–10.3)
Creatinine, Ser: 0.9 mg/dL (ref 0.44–1.00)
GFR calc Af Amer: >60 mL/min (ref >60)
Glucose, Bld: 138 mg/dL — ABNORMAL HIGH (ref 70–99)
POTASSIUM: 3.4 mmol/L — AB (ref 3.5–5.1)
SODIUM: 141 mmol/L (ref 135–145)
Total Bilirubin: 0.8 mg/dL (ref 0.3–1.2)
Total Protein: 7.6 g/dL (ref 6.5–8.1)

## 2017-11-18 LAB — URINALYSIS, COMPLETE (UACMP) WITH MICROSCOPIC
Bilirubin Urine: NEGATIVE
GLUCOSE, UA: NEGATIVE mg/dL
KETONES UR: NEGATIVE mg/dL
NITRITE: NEGATIVE
Protein, ur: NEGATIVE mg/dL
Specific Gravity, Urine: 1.009 (ref 1.005–1.030)
pH: 6 (ref 5.0–8.0)

## 2017-11-18 LAB — LACTIC ACID, PLASMA: LACTIC ACID, VENOUS: 1.9 mmol/L (ref 0.5–1.9)

## 2017-11-18 LAB — LIPASE, BLOOD: Lipase: 30 U/L (ref 11–51)

## 2017-11-18 MED ORDER — ONDANSETRON HCL 4 MG/2ML IJ SOLN
4.0000 mg | Freq: Once | INTRAMUSCULAR | Status: AC
  Administered 2017-11-18: 4 mg via INTRAVENOUS

## 2017-11-18 MED ORDER — ONDANSETRON 4 MG PO TBDP: ORAL_TABLET | ORAL | 0 refills | Status: DC

## 2017-11-18 MED ORDER — CEPHALEXIN 500 MG PO CAPS: 500.0000 mg | ORAL_CAPSULE | Freq: Three times a day (TID) | ORAL | 0 refills | Status: DC

## 2017-11-18 MED ORDER — ACETAMINOPHEN 500 MG PO TABS
1000.0000 mg | ORAL_TABLET | Freq: Once | ORAL | Status: AC
  Administered 2017-11-18: 1000 mg via ORAL
  Filled 2017-11-18: qty 2

## 2017-11-18 MED ORDER — ONDANSETRON HCL 4 MG/2ML IJ SOLN
INTRAMUSCULAR | Status: AC
  Filled 2017-11-18: qty 2

## 2017-11-18 MED ORDER — IOHEXOL 300 MG/ML  SOLN
100.0000 mL | Freq: Once | INTRAMUSCULAR | Status: AC | PRN
  Administered 2017-11-18: 100 mL via INTRAVENOUS

## 2017-11-18 MED ORDER — MORPHINE SULFATE (PF) 4 MG/ML IV SOLN
INTRAVENOUS | Status: AC
  Filled 2017-11-18: qty 1

## 2017-11-18 MED ORDER — MORPHINE SULFATE (PF) 4 MG/ML IV SOLN
4.0000 mg | Freq: Once | INTRAVENOUS | Status: AC
  Administered 2017-11-18: 4 mg via INTRAVENOUS

## 2017-11-18 MED ORDER — SODIUM CHLORIDE 0.9 % IV SOLN
1.0000 g | Freq: Once | INTRAVENOUS | Status: AC
  Administered 2017-11-18: 1 g via INTRAVENOUS
  Filled 2017-11-18: qty 10

## 2017-11-18 MED ORDER — SODIUM CHLORIDE 0.9 % IV BOLUS
1000.0000 mL | Freq: Once | INTRAVENOUS | Status: AC
  Administered 2017-11-18: 1000 mL via INTRAVENOUS

## 2017-11-18 NOTE — ED Notes (Signed)
ED Provider at bedside. 

## 2017-11-18 NOTE — ED Provider Notes (Signed)
[facility] Emergency Department Provider Note  ____________________________________________  Time seen: Approximately 7:53 PM  I have reviewed the triage vital signs and the nursing notes.   HISTORY  Chief Complaint Abdominal Pain; Emesis; and Fever   HPI Jaime Brock is a 52 y.o. female with a history of appendectomy in 08/2017, cholecystectomy, and kidney stone who presents for evaluation of Left sided abdominal pain.  Patient reports sudden onset of left-sided abdominal pain that she describes as burning constant pain radiating to her back since earlier today.  She has had a fever, nausea, and one episode of nonbloody nonbilious emesis. She is also complaining of generalized body aches. She reports that her pain is 10 out of 10 at this time.  She denies ever having similar pain.  No dysuria or hematuria, no constipation or diarrhea, no chest pain or shortness of breath.  Past Medical History:  Diagnosis Date  . 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 History of right hemicolectomy 10/28/2017    Past Surgical History:  Procedure Laterality Date  . CHOLECYSTECTOMY    . COLONOSCOPY WITH PROPOFOL N/A 07/07/2017   Procedure: COLONOSCOPY WITH PROPOFOL;  Surgeon: Lin Landsman, MD;  Location: [facility] ENDOSCOPY;  Service: Gastroenterology;  Laterality: N/A;  . LAPAROSCOPIC APPENDECTOMY N/A 08/31/2017   Procedure: APPENDECTOMY LAPAROSCOPIC;  Surgeon: Olean Ree, MD;  Location: [facility] ORS;  Service: General;  Laterality: N/A;  . TUBAL LIGATION      Prior to Admission medications   Medication Sig Start Date End Date Taking? Authorizing Provider  HOMEOPATHIC PRODUCTS EX Take 5 mLs by mouth every 4 (four) hours as needed (FOR COUGH). [place] SYRUP WITH NATURAL PLANT EXTRACTS    [provider]  ibuprofen (ADVIL,MOTRIN) 600 MG tablet Take 1 tablet (600 mg total) by mouth  every 8 (eight) hours as needed for fever, mild pain or moderate pain. 08/31/17   Piscoya, Jose, MD  PROCTOSOL HC 2.5 % rectal cream APPLY TO AFFECTED AREA UP TO 3 TO 4 TIMES DAILY FOR HEMORRHOIDS (SPANISH INSTRUC) 10/22/17   [provider]  Pseudoeph-Doxylamine-DM-APAP (NYQUIL PO) Take 15-30 mLs by mouth at bedtime as needed (FOR COUGH AT NIGHT).    [provider]    Allergies Patient has no known allergies.  Family History  Problem Relation Age of Onset  . Diabetes Mother   . Gallbladder disease Mother   . Hypertension Mother   . Breast cancer Neg Hx   . Cancer Neg Hx     Social History Social History   Tobacco Use  . Smoking status: Never Smoker  . Smokeless tobacco: Never Used  Substance Use Topics  . Alcohol use: No    Frequency: Never  . Drug use: No    Review of Systems  Constitutional: Negative for fever.+ body aches Eyes: Negative for visual changes. ENT: Negative for sore throat. Neck: No neck pain  Cardiovascular: Negative for chest pain. Respiratory: Negative for shortness of breath. Gastrointestinal: + abdominal pain, nausea, and vomiting. No diarrhea. Genitourinary: Negative for dysuria. Musculoskeletal: Negative for back pain. Skin: Negative for rash. Neurological: Negative for headaches, weakness or numbness. Psych: No SI or HI  ____________________________________________   PHYSICAL EXAM:  VITAL SIGNS: ED Triage Vitals  Enc Vitals Group     BP 11/18/17 1926 119/72     Pulse Rate 11/18/17 1926 (!) 102     Resp 11/18/17 1926 18  Temp 11/18/17 1926 (!) 100.6 F (38.1 C)     Temp Source 11/18/17 1926 Oral     SpO2 11/18/17 1926 96 %     Weight 11/18/17 1928 209 lb (94.8 kg)     Height 11/18/17 1928 4\' 11"  (1.499 m)     Head Circumference --      Peak Flow --      Pain Score 11/18/17 1927 9     Pain Loc --      Pain Edu? --      Excl. in [place]? --     Constitutional: Alert and oriented, patient looks  uncomfortable. HEENT:      Head: Normocephalic and atraumatic.         Eyes: Conjunctivae are normal. Sclera is non-icteric.       Mouth/Throat: Mucous membranes are moist.       Neck: Supple with no signs of meningismus. Cardiovascular: Regular rate and rhythm. No murmurs, gallops, or rubs. 2+ symmetrical distal pulses are present in all extremities. No JVD. Respiratory: Normal respiratory effort. Lungs are clear to auscultation bilaterally. No wheezes, crackles, or rhonchi.  Gastrointestinal: Soft, tenderness to palpation on the left quadrants with no rebound or guarding  Musculoskeletal: Nontender with normal range of motion in all extremities. No edema, cyanosis, or erythema of extremities. Neurologic: Normal speech and language. Face is symmetric. Moving all extremities. No gross focal neurologic deficits are appreciated. Skin: Skin is warm, dry and intact. No rash noted. Psychiatric: Mood and affect are normal. Speech and behavior are normal.  ____________________________________________   LABS (all labs ordered are listed, but only abnormal results are displayed)  Labs Reviewed  CBC WITH DIFFERENTIAL/PLATELET - Abnormal; Notable for the following components:      Result Value   RDW 15.1 (*)    Neutro Abs 9.7 (*)    Lymphs Abs 0.9 (*)    All other components within normal limits  COMPREHENSIVE METABOLIC PANEL - Abnormal; Notable for the following components:   Potassium 3.4 (*)    Glucose, Bld 138 (*)    All other components within normal limits  URINALYSIS, COMPLETE (UACMP) WITH MICROSCOPIC - Abnormal; Notable for the following components:   Color, Urine YELLOW (*)    APPearance HAZY (*)    Hgb urine dipstick MODERATE (*)    Leukocytes, UA LARGE (*)    WBC, UA >50 (*)    Bacteria, UA MANY (*)    All other components within normal limits  CULTURE, BLOOD (ROUTINE X 2)  CULTURE, BLOOD (ROUTINE X 2)  URINE CULTURE  LACTIC ACID, PLASMA  LIPASE, BLOOD    ____________________________________________  EKG  none  ____________________________________________  RADIOLOGY CT a/p: PND ____________________________________________   PROCEDURES  Procedure(s) performed: None Procedures Critical Care performed:  None ____________________________________________   INITIAL IMPRESSION / ASSESSMENT AND PLAN / ED COURSE  52 y.o. female with a history of appendectomy in 08/2017, cholecystectomy, and kidney stone who presents for evaluation of Left sided abdominal pain, fever, body aches, nausea and vomiting since earlier today.  Patient is apparent distress due to pain, she is tachycardic with a pulse of 102 and has a fever of 100.31F.  Her abdomen is soft with tenderness to palpation on the left quadrants, there is no rebound or guarding.  Differential diagnoses including but not limited to intra-abdominal abscess from surgery few months ago, SBO, bowel perforation, diverticulitis. Will give morphine, zofran, fluids, tylenol for fever. Check CBC, lactic, CMP, lipase, UA and CT a/p.  _________________________ 8:35 PM on 11/18/2017 -----------------------------------------  Normal white count, normal lactic acid.  UA positive for UTI.  Patient will be treated for pyelonephritis with Rocephin.  Urine and blood cultures are pending.  CT is pending.  Care transferred to Dr. Karma Greaser   As part of my medical decision making, I reviewed the following data within the [place] notes reviewed and incorporated, Labs reviewed , Old chart reviewed, Radiograph reviewed , Notes from prior ED visits and [place] Controlled Substance Database    Pertinent labs & imaging results that were available during my care of the patient were reviewed by me and considered in my medical decision making (see chart for details).    ____________________________________________   FINAL CLINICAL IMPRESSION(S) / ED DIAGNOSES  Final diagnoses:   Pyelonephritis      NEW MEDICATIONS STARTED DURING THIS VISIT:  ED Discharge Orders    None       Note:  This document was prepared using Dragon voice recognition software and may include unintentional dictation errors.    Alfred Levins, [place], MD 11/18/17 2036

## 2017-11-18 NOTE — ED Provider Notes (Signed)
-----------------------------------------   8:32 PM on 11/18/2017 -----------------------------------------   Assuming care from Dr. Alfred Levins.  In short, Jaime Brock is a 52 y.o. female with a chief complaint of fever and abdominal pain.  Refer to the original H&P for additional details.  The current plan of care is to check UA, lactate, and CT and reassess to determine disposition.   ----------------------------------------- 9:50 PM on 11/18/2017 -----------------------------------------  Urinalysis does appear grossly infected and urine culture has been ordered by Dr. Alfred Levins.  Lactate was within normal limits.  CT scan of the abdomen and pelvis showed no acute or emergent abnormality.  I believe that the patient is suffering from a mild pyelonephritis but has no signs of sepsis that would require the patient to stay in the hospital.  Her tachycardia has improved with IV fluids and she received ceftriaxone 1 g IV as per the orders from Dr. Alfred Levins.  Blood cultures were sent to make sure there is bacteremia.  I ordered Keflex for appropriate pyelonephritis dosing and I discussed the results with the patient and her family.  (The patient and/or family speak(s) Spanish.  They understand they have the right to the use of a hospital interpreter, however at this time they prefer to speak directly with me in [place].  They know that they can ask for an interpreter at any time.)  I gave my usual and customary return precautions.  They are comfortable with the plan.    Hinda Kehr, MD 11/18/17 2152

## 2017-11-18 NOTE — ED Notes (Signed)
Reviewed discharge instructions, follow-up care, and prescriptions with patient. Patient verbalized understanding of all information reviewed. Patient stable, with no distress noted at this time.     This RN used interpretor to discharge patient.

## 2017-11-18 NOTE — ED Notes (Signed)
Patient reporting nausea. MD informed.

## 2017-11-18 NOTE — ED Notes (Signed)
RN to room to draw blood cultures. Patient at CT

## 2017-11-18 NOTE — ED Triage Notes (Signed)
Pt to triage via wheelchair. Per pt son pt had lap appy about 1 month ago. Today around 1 pm pt developed left lower abd pain with vomiting and a low grade fever.

## 2017-11-18 NOTE — ED Triage Notes (Signed)
First RN Note: pt presents to ED with c/o fever and generalized body aches that started today. Pt's family member reports approx 1 month ago had laparoscopic appendectomy, reports today has only had 600mg  Ibuprofen without relief.

## 2017-11-19 ENCOUNTER — Other Ambulatory Visit: Payer: Self-pay

## 2017-11-19 ENCOUNTER — Inpatient Hospital Stay
Admit: 2017-11-19 | Discharge: 2017-11-19 | Disposition: A | Discharge status: Home / Self Care | Payer: Commercial Managed Care - PPO | Attending: Internal Medicine | Admitting: Internal Medicine

## 2017-11-19 ENCOUNTER — Inpatient Hospital Stay
Admission: EM | Admit: 2017-11-19 | Discharge: 2017-11-21 | DRG: 872 | Disposition: A | Discharge status: Home / Self Care | Payer: Commercial Managed Care - PPO | Source: Ambulatory Visit | Attending: Internal Medicine | Admitting: Internal Medicine

## 2017-11-19 DIAGNOSIS — Z6841 Body Mass Index (BMI) 40.0 and over, adult: Secondary | ICD-10-CM

## 2017-11-19 DIAGNOSIS — N390 Urinary tract infection, site not specified: Secondary | ICD-10-CM | POA: Diagnosis present

## 2017-11-19 DIAGNOSIS — B952 Enterococcus as the cause of diseases classified elsewhere: Secondary | ICD-10-CM | POA: Diagnosis present

## 2017-11-19 DIAGNOSIS — E876 Hypokalemia: Secondary | ICD-10-CM | POA: Diagnosis present

## 2017-11-19 DIAGNOSIS — A419 Sepsis, unspecified organism: Secondary | ICD-10-CM | POA: Diagnosis present

## 2017-11-19 DIAGNOSIS — R7881 Bacteremia: Secondary | ICD-10-CM | POA: Diagnosis present

## 2017-11-19 DIAGNOSIS — A4151 Sepsis due to Escherichia coli [E. coli]: Principal | ICD-10-CM | POA: Diagnosis present

## 2017-11-19 LAB — BASIC METABOLIC PANEL
ANION GAP: 7 (ref 5–15)
BUN: 13 mg/dL (ref 6–20)
CALCIUM: 8.6 mg/dL — AB (ref 8.9–10.3)
CO2: 23 mmol/L (ref 22–32)
Chloride: 110 mmol/L (ref 98–111)
Creatinine, Ser: 0.58 mg/dL (ref 0.44–1.00)
GFR calc Af Amer: >60 mL/min (ref >60)
GFR calc non Af Amer: >60 mL/min (ref >60)
GLUCOSE: 99 mg/dL (ref 70–99)
Potassium: 3.4 mmol/L — ABNORMAL LOW (ref 3.5–5.1)
Sodium: 140 mmol/L (ref 135–145)

## 2017-11-19 LAB — BLOOD CULTURE ID PANEL (REFLEXED)
Acinetobacter baumannii: NOT DETECTED
CANDIDA ALBICANS: NOT DETECTED
CANDIDA GLABRATA: NOT DETECTED
CANDIDA TROPICALIS: NOT DETECTED
Candida krusei: NOT DETECTED
Candida parapsilosis: NOT DETECTED
Carbapenem resistance: NOT DETECTED
ENTEROCOCCUS SPECIES: NOT DETECTED
ESCHERICHIA COLI: DETECTED — AB
Enterobacter cloacae complex: NOT DETECTED
Enterobacteriaceae species: DETECTED — AB
Haemophilus influenzae: NOT DETECTED
Klebsiella oxytoca: NOT DETECTED
Klebsiella pneumoniae: NOT DETECTED
LISTERIA MONOCYTOGENES: NOT DETECTED
NEISSERIA MENINGITIDIS: NOT DETECTED
Proteus species: NOT DETECTED
Pseudomonas aeruginosa: NOT DETECTED
STAPHYLOCOCCUS AUREUS BCID: NOT DETECTED
STREPTOCOCCUS AGALACTIAE: NOT DETECTED
STREPTOCOCCUS SPECIES: NOT DETECTED
Serratia marcescens: NOT DETECTED
Staphylococcus species: NOT DETECTED
Streptococcus pneumoniae: NOT DETECTED
Streptococcus pyogenes: NOT DETECTED

## 2017-11-19 LAB — CBC
HEMATOCRIT: 36 % (ref 35.0–47.0)
Hemoglobin: 12.5 g/dL (ref 12.0–16.0)
MCH: 30.2 pg (ref 26.0–34.0)
MCHC: 34.6 g/dL (ref 32.0–36.0)
MCV: 87.1 fL (ref 80.0–100.0)
Platelets: 225 10*3/uL (ref 150–440)
RBC: 4.14 MIL/uL (ref 3.80–5.20)
RDW: 15.1 % — AB (ref 11.5–14.5)
WBC: 14.6 10*3/uL — ABNORMAL HIGH (ref 3.6–11.0)

## 2017-11-19 LAB — URINALYSIS, COMPLETE (UACMP) WITH MICROSCOPIC
Bilirubin Urine: NEGATIVE
Glucose, UA: NEGATIVE mg/dL
KETONES UR: NEGATIVE mg/dL
Nitrite: NEGATIVE
PROTEIN: 30 mg/dL — AB
Specific Gravity, Urine: 1.019 (ref 1.005–1.030)
WBC, UA: >50 WBC/hpf — ABNORMAL HIGH (ref 0–5)
pH: 6 (ref 5.0–8.0)

## 2017-11-19 MED ORDER — ONDANSETRON HCL 4 MG/2ML IJ SOLN
4.0000 mg | Freq: Four times a day (QID) | INTRAMUSCULAR | Status: DC | PRN
  Administered 2017-11-19: 18:00:00 4 mg via INTRAVENOUS
  Filled 2017-11-19: qty 2

## 2017-11-19 MED ORDER — ACETAMINOPHEN 650 MG RE SUPP: 650.0000 mg | Freq: Four times a day (QID) | RECTAL | Status: DC | PRN

## 2017-11-19 MED ORDER — ENOXAPARIN SODIUM 40 MG/0.4ML ~~LOC~~ SOLN
40.0000 mg | SUBCUTANEOUS | Status: DC
  Administered 2017-11-19 – 2017-11-20 (×2): 40 mg via SUBCUTANEOUS
  Filled 2017-11-19 (×2): qty 0.4

## 2017-11-19 MED ORDER — ONDANSETRON HCL 4 MG PO TABS: 4.0000 mg | ORAL_TABLET | Freq: Four times a day (QID) | ORAL | Status: DC | PRN

## 2017-11-19 MED ORDER — TRAZODONE HCL 50 MG PO TABS
25.0000 mg | ORAL_TABLET | Freq: Every evening | ORAL | Status: DC | PRN
  Administered 2017-11-20: 20:00:00 25 mg via ORAL
  Filled 2017-11-19: qty 1

## 2017-11-19 MED ORDER — ACETAMINOPHEN 325 MG PO TABS
650.0000 mg | ORAL_TABLET | Freq: Four times a day (QID) | ORAL | Status: DC | PRN
  Administered 2017-11-19 – 2017-11-20 (×2): 650 mg via ORAL
  Filled 2017-11-19 (×2): qty 2

## 2017-11-19 MED ORDER — SODIUM CHLORIDE 0.9 % IV SOLN
1.0000 g | Freq: Three times a day (TID) | INTRAVENOUS | Status: DC
  Administered 2017-11-19 – 2017-11-21 (×6): 1 g via INTRAVENOUS
  Filled 2017-11-19 (×8): qty 1

## 2017-11-19 MED ORDER — CEFTRIAXONE SODIUM 1 G IJ SOLR: 1.0000 g | INTRAMUSCULAR | Status: DC

## 2017-11-19 MED ORDER — ONDANSETRON HCL 4 MG/2ML IJ SOLN: 4.0000 mg | Freq: Four times a day (QID) | INTRAMUSCULAR | Status: DC | PRN

## 2017-11-19 MED ORDER — BISACODYL 5 MG PO TBEC
5.0000 mg | DELAYED_RELEASE_TABLET | Freq: Every day | ORAL | Status: DC | PRN
  Filled 2017-11-19: qty 1

## 2017-11-19 MED ORDER — DOCUSATE SODIUM 100 MG PO CAPS
100.0000 mg | ORAL_CAPSULE | Freq: Two times a day (BID) | ORAL | Status: DC
  Administered 2017-11-19 – 2017-11-20 (×4): 100 mg via ORAL
  Filled 2017-11-19 (×5): qty 1

## 2017-11-19 MED ORDER — SODIUM CHLORIDE 0.9 % IV SOLN
INTRAVENOUS | Status: DC
  Administered 2017-11-19 – 2017-11-21 (×4): via INTRAVENOUS

## 2017-11-19 MED ORDER — SODIUM CHLORIDE 0.9 % IV SOLN
1.0000 g | Freq: Once | INTRAVENOUS | Status: AC
  Administered 2017-11-19: 1 g via INTRAVENOUS
  Filled 2017-11-19: qty 10

## 2017-11-19 NOTE — ED Triage Notes (Signed)
Pt arrives to ED with family who states that pt was told she has an infection in blood. Had positive blood cultures. Pt is spanish speaking. Son speaking for pt. Feels numbness in face and fingers per pt, sometimes toes as well.

## 2017-11-19 NOTE — Progress Notes (Signed)
Chaplain responded to an OR for prayer. Nurse was registering Pt initially and Chaplain returned. Pt is Spanish speaking but son is Optometrist. Chaplain prayed a prayer adapted from [employer] [place] "You are the only source of Health." Pt said there was no other needs.   11/19/17 1500  Clinical Encounter Type  Visited With Patient and family together  Visit Type Initial;Spiritual support  Referral From Nurse  Spiritual Encounters  Spiritual Needs Prayer

## 2017-11-19 NOTE — ED Provider Notes (Signed)
-----------------------------------------   10:53 AM on 11/19/2017 -----------------------------------------  Patient had positive blood culture detected.  Was febrile on arrival yesterday with what appears to be urinary tract infection, however now that the blood cultures have grown out positive, I called the patient at home and urged her to come back to the emergency department.  I spoke mostly to the son as the patient does not speak [place].  They are agreeable to this plan, and the son states he will take her back to the emergency department momentarily.  Once the patient arrived she will need admission to the hospital for IV antibiotics.   Harvest Dark, MD 11/19/17 1054

## 2017-11-19 NOTE — Progress Notes (Signed)
*  PRELIMINARY RESULTS* Echocardiogram 2D Echocardiogram has been performed.  Jaime Brock 11/19/2017, 4:50 PM

## 2017-11-19 NOTE — Progress Notes (Addendum)
Pharmacy Antibiotic Note  Jaime Brock is a 52 y.o. female admitted on 11/19/2017 with bacteremia.  Pharmacy has been consulted for meropenem dosing. Pt called back to ED for positive blood cultures. Initially received ceftriaxone. Called MD at 1248 to change to meropenem, MD did not call back until 1351.   Plan: Meropenem 1g IV q8h  Height: 4\' 11"  (149.9 cm) Weight: 209 lb (94.8 kg) IBW/kg (Calculated) : 43.2  Temp (24hrs), Avg:99.2 F (37.3 C), Min:98.4 F (36.9 C), Max:100.6 F (38.1 C)  Recent Labs  Lab 11/18/17 1952  WBC 10.9  CREATININE 0.90  LATICACIDVEN 1.9    Estimated Creatinine Clearance: 74.5 mL/min (by C-G formula based on SCr of 0.9 mg/dL).    No Known Allergies  Antimicrobials this admission: CTX 7/12>>7/13 Meropenem 7/13>>  Dose adjustments this admission:   Microbiology results: BCID from [facility]x on 7/12 with E coli 7/12 BCx x2 GNR 7/13 BCx x2 ordered UCx sent  Thank you for allowing pharmacy to be a part of this patient's care.  Rocky Morel 11/19/2017 2:12 PM

## 2017-11-19 NOTE — Progress Notes (Signed)
PHARMACY - PHYSICIAN COMMUNICATION CRITICAL VALUE ALERT - BLOOD CULTURE IDENTIFICATION (BCID)  Jaime Brock is an 52 y.o. female who presented to [facility] on 11/18/2017 with a chief complaint of left lower abd pain and fever  Assessment:  Pt discharged from ED with dx of mild pyelonephritis (discharged late last night)  (include suspected source if known)  Name of physician (or Provider) Contacted: Dr Kerman Passey  Current antibiotics: pt d/c'd on Keflex TID  Changes to prescribed antibiotics recommended:  MD stated he would call pt to return to ED for treatment   Results for orders placed or performed during the hospital encounter of 11/18/17  Blood Culture ID Panel (Reflexed) (Collected: 11/18/2017  9:15 PM)  Result Value Ref Range   Enterococcus species NOT DETECTED NOT DETECTED   Listeria monocytogenes NOT DETECTED NOT DETECTED   Staphylococcus species NOT DETECTED NOT DETECTED   Staphylococcus aureus NOT DETECTED NOT DETECTED   Streptococcus species NOT DETECTED NOT DETECTED   Streptococcus agalactiae NOT DETECTED NOT DETECTED   Streptococcus pneumoniae NOT DETECTED NOT DETECTED   Streptococcus pyogenes NOT DETECTED NOT DETECTED   Acinetobacter baumannii NOT DETECTED NOT DETECTED   Enterobacteriaceae species DETECTED (A) NOT DETECTED   Enterobacter cloacae complex NOT DETECTED NOT DETECTED   Escherichia coli DETECTED (A) NOT DETECTED   Klebsiella oxytoca NOT DETECTED NOT DETECTED   Klebsiella pneumoniae NOT DETECTED NOT DETECTED   Proteus species NOT DETECTED NOT DETECTED   Serratia marcescens NOT DETECTED NOT DETECTED   Carbapenem resistance NOT DETECTED NOT DETECTED   Haemophilus influenzae NOT DETECTED NOT DETECTED   Neisseria meningitidis NOT DETECTED NOT DETECTED   Pseudomonas aeruginosa NOT DETECTED NOT DETECTED   Candida albicans NOT DETECTED NOT DETECTED   Candida glabrata NOT DETECTED NOT DETECTED   Candida krusei NOT DETECTED NOT DETECTED   Candida  parapsilosis NOT DETECTED NOT DETECTED   Candida tropicalis NOT DETECTED NOT DETECTED  both anaerobic bottle of each set with GNR  Rocky Morel 11/19/2017  10:48 AM

## 2017-11-19 NOTE — H&P (Addendum)
[place] [facility] [place] NAME: Jaime Brock    MR#:  [id number]  DATE OF BIRTH:  07-Nov-1965  DATE OF ADMISSION:  11/19/2017  PRIMARY CARE PHYSICIAN: [facility] [place]   REQUESTING/REFERRING PHYSICIAN: Dr. Harvest Dark  CHIEF COMPLAINT: blood infection    Chief Complaint  Patient presents with  . Blood Infection    HISTORY OF PRESENT ILLNESS:  Jaime Brock  is a 52 y.o. female with past medical history regarding the repulse of bed infection. Patient was in the emergency room yesterday for abdominal pain, chills, fever, treated with Keflex and discharged from ER yesterday, called by microbiology had both anaerobic bottles showing gram-negative bacteria. So patient is brought in again. She complains of generalized weakness, subjective fevers, chills but abdominal pain is better than yesterday.spoke with help of Spanish interpreter Estill Bamberg.  PAST MEDICAL HISTORY:   Past Medical History:  Diagnosis Date  . 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 CHOLECYSTECTOMY    . COLONOSCOPY WITH PROPOFOL N/A 07/07/2017   Procedure: COLONOSCOPY WITH PROPOFOL;  Surgeon: Lin Landsman, MD;  Location: [facility] ENDOSCOPY;  Service: Gastroenterology;  Laterality: N/A;  . LAPAROSCOPIC APPENDECTOMY N/A 08/31/2017   Procedure: APPENDECTOMY LAPAROSCOPIC;  Surgeon: Olean Ree, MD;  Location: [facility] ORS;  Service: General;  Laterality: N/A;  . TUBAL LIGATION      SOCIAL HISTORY:   Social History   Tobacco Use  . Smoking status: Never Smoker  . Smokeless tobacco: Never Used  Substance Use Topics  . Alcohol use: No    Frequency: Never    FAMILY HISTORY:   Family History  Problem Relation Age of Onset  . Diabetes Mother   . Gallbladder disease Mother   . Hypertension Mother   . Breast cancer Neg Hx   . Cancer Neg Hx      DRUG ALLERGIES:  No Known Allergies  REVIEW OF SYSTEMS:  CONSTITUTIONAL: generalized weakness.  EYES: No blurred or double vision.  EARS, NOSE, AND THROAT: No tinnitus or ear pain.  RESPIRATORY: No cough, shortness of breath, wheezing or hemoptysis.  CARDIOVASCULAR: No chest pain, orthopnea, edema.  GASTROINTESTINAL: No nausea, vomiting, diarrhea or abdominal pain.  GENITOURINARY: No dysuria, hematuria.  ENDOCRINE: No polyuria, nocturia,  HEMATOLOGY: No anemia, easy bruising or bleeding SKIN: No rash or lesion. MUSCULOSKELETAL: No joint pain or arthritis.   NEUROLOGIC: No tingling, numbness, weakness.  PSYCHIATRY: No anxiety or depression.   MEDICATIONS AT HOME:   Prior to Admission medications   Medication Sig Start Date End Date Taking? Authorizing Provider  ibuprofen (ADVIL,MOTRIN) 600 MG tablet Take 1 tablet (600 mg total) by mouth every 8 (eight) hours as needed for fever, mild pain or moderate pain. 08/31/17  Yes Piscoya, Jacqulyn Bath, MD  cephALEXin (KEFLEX) 500 MG capsule Take 1 capsule (500 mg total) by mouth 3 (three) times daily. Patient not taking: Reported on 11/19/2017 11/18/17   Hinda Kehr, MD  ondansetron (ZOFRAN ODT) 4 MG disintegrating tablet Allow 1-2 tablets to dissolve in your mouth every 8 hours as needed for nausea/vomiting Patient not taking: Reported on 11/19/2017 11/18/17   Hinda Kehr, MD      VITAL SIGNS:  Blood pressure 110/76, pulse 68, temperature 98.7 F (37.1 C), temperature source Oral, resp. rate 20, height 4\' 11"  (1.499 m), weight 94.8 kg (209 lb), last menstrual period 08/17/2017, SpO2 95 %.  PHYSICAL EXAMINATION:  GENERAL:  52 y.o.-year-old patient lying in the bed with no acute distress.  EYES: Pupils equal, round, reactive to light and accommodation. No scleral icterus. Extraocular muscles intact.  HEENT: Head atraumatic, normocephalic. Oropharynx and nasopharynx clear.  NECK:  Supple, no jugular venous distention. No thyroid enlargement,  no tenderness.  LUNGS: Normal breath sounds bilaterally, no wheezing, rales,rhonchi or crepitation. No use of accessory muscles of respiration.  CARDIOVASCULAR: S1, S2 normal. No murmurs, rubs, or gallops.  ABDOMEN: Soft, slight left lower quadrant tenderness present.  EXTREMITIES: No pedal edema, cyanosis, or clubbing.  NEUROLOGIC: Cranial nerves II through XII are intact. Muscle strength 5/5 in all extremities. Sensation intact. Gait not checked.  PSYCHIATRIC: The patient is alert and oriented x 3.  SKIN: No obvious rash, lesion, or ulcer.   LABORATORY PANEL:   CBC Recent Labs  Lab 11/18/17 1952  WBC 10.9  HGB 12.7  HCT 37.0  PLT 202   ------------------------------------------------------------------------------------------------------------------  Chemistries  Recent Labs  Lab 11/18/17 1952  NA 141  K 3.4*  CL 110  CO2 22  GLUCOSE 138*  BUN 20  CREATININE 0.90  CALCIUM 9.0  AST 28  ALT 16  ALKPHOS 90  BILITOT 0.8   ------------------------------------------------------------------------------------------------------------------  Cardiac Enzymes No results for input(s): TROPONINI in the last 168 hours. ------------------------------------------------------------------------------------------------------------------  RADIOLOGY:  Ct Abdomen Pelvis W Contrast  Result Date: 11/18/2017 CLINICAL DATA:  Per pt son pt had lap appy about 1 month ago. Today around 1 pm pt developed left lower abd pain with vomiting and a low grade fever. EXAM: CT ABDOMEN AND PELVIS WITH CONTRAST TECHNIQUE: Multidetector CT imaging of the abdomen and pelvis was performed using the standard protocol following bolus administration of intravenous contrast. CONTRAST:  190mL OMNIPAQUE IOHEXOL 300 MG/ML  SOLN COMPARISON:  07/19/2017 FINDINGS: Lower chest: No acute abnormality. Hepatobiliary: No focal liver abnormality is seen. Status post cholecystectomy. No biliary dilatation. Pancreas:  Unremarkable. No pancreatic ductal dilatation or surrounding inflammatory changes. Spleen: Normal in size without focal abnormality. Adrenals/Urinary Tract: No adrenal masses. Left renal scarring. 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 Appearance stable from the prior CT. No right renal masses or stones. No right hydronephrosis. Ureters are normal course and in caliber. No ureteral stones. Bladder is unremarkable. Stomach/Bowel: Anastomosis staples noted along the cecal tip from the previous appendectomy. Colon is normal in caliber. No wall thickening or inflammation. Stomach and small bowel are within normal limits. No peritoneal inflammation. No fluid collection to suggest a postoperative complication. Vascular/Lymphatic: No significant vascular findings are present. No enlarged abdominal or pelvic lymph nodes. Reproductive: Uterus and bilateral adnexa are unremarkable. Other: No abdominal wall hernia or abnormality. No abdominopelvic ascites. Musculoskeletal: No fracture or acute finding. No osteoblastic or osteolytic lesions. IMPRESSION: 1. No acute findings within the abdomen or pelvis. No evidence of a complication from the previous appendectomy. 2. Left renal scarring and intrarenal collecting system dilation associated with a staghorn calculus. Appearance is stable from the prior CT. 3. Status post cholecystectomy. 4. No evidence of bowel inflammation. Electronically Signed   By: Lajean Manes M.D.   On: 11/18/2017 21:22    EKG:   Orders placed or performed during the hospital encounter of 01/22/16  . EKG 12-Lead  . EKG 12-Lead  . ED EKG within 10 minutes  . ED EKG within 10 minutes  . EKG    IMPRESSION AND PLAN:   51 year old female patient with no significant past medical history comes  in with the fever, abdominal pain, chills, discharged from ER yesterday for UTI and given Keflex and comes back today because of call from your ER for blood infection.  1,  Sepsis with Ecoli and enterococccal bacteremia; 52 year old female patient with abdominal pain, chills, fever seen in the emergency room yesterday for UTI, CT abdomen done yesterday showed no acute findings in abdomen, pelvis, patient has left renal stag on calculus, stable from before. And now she has gram-negative bacteremia. Continue IV meropenem IV fluids... Blood cultures start showing Enterobacter  And ecoli bacteremia; echocardiogram, rpt blood cultures are ordered.  2.Mild hypokalemia;replace K D/w help of hospital provided  [place] interpreter,   All the records are reviewed and case discussed with ED provider. Management plans discussed with the patient, family and they are in agreement.  CODE STATUS: full code  TOTAL TIME TAKING CARE OF THIS PATIENT: 55 minutes.    Epifanio Lesches M.D on 11/19/2017 at 1:52 PM  Between 7am to 6pm - Pager - [phone]  After 6pm go to [email or web address] - password [email or web address] [place] [facility]  Office  [phone]  CC: Primary care physician; [facility] [place]  Note: This dictation was prepared with Diplomatic Services operational officer dictation along with smaller phrase technology. Any transcriptional errors that result from this process are unintentional.

## 2017-11-19 NOTE — ED Notes (Signed)
Lab called and verified that lab specimens including urine were received.

## 2017-11-19 NOTE — ED Provider Notes (Signed)
[facility] Emergency Department Provider Note  Time seen: 12:13 PM  I have reviewed the triage vital signs and the nursing notes.   HISTORY  Chief Complaint Blood Infection    HPI Jaime Brock is a 52 y.o. female with no significant past medical history presents to the emergency department after being called back by myself for positive blood cultures.  Today was called by the pharmacist to inform me that the patient had both anaerobic bottles grow out gram-negative bacteria.  I called the patient home and had her come back to the hospital.  Here the patient appears well, she continues to state subjective fever at times, was very nauseated with one episode of vomiting this morning.  Patient states mild burning with urination.  Occasional lower abdominal pain, denies any diarrhea.  No cough.  No chest pain.  Negative review of systems otherwise.   Past Medical History:  Diagnosis Date  . 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 History of right hemicolectomy 10/28/2017    Past Surgical History:  Procedure Laterality Date  . CHOLECYSTECTOMY    . COLONOSCOPY WITH PROPOFOL N/A 07/07/2017   Procedure: COLONOSCOPY WITH PROPOFOL;  Surgeon: Lin Landsman, MD;  Location: [facility] ENDOSCOPY;  Service: Gastroenterology;  Laterality: N/A;  . LAPAROSCOPIC APPENDECTOMY N/A 08/31/2017   Procedure: APPENDECTOMY LAPAROSCOPIC;  Surgeon: Olean Ree, MD;  Location: [facility] ORS;  Service: General;  Laterality: N/A;  . TUBAL LIGATION      Prior to Admission medications   Medication Sig Start Date End Date Taking? Authorizing Provider  cephALEXin (KEFLEX) 500 MG capsule Take 1 capsule (500 mg total) by mouth 3 (three) times daily. 11/18/17   Hinda Kehr, MD  HOMEOPATHIC PRODUCTS EX Take 5 mLs by mouth every 4 (four) hours as needed (FOR COUGH). [place] SYRUP WITH NATURAL PLANT EXTRACTS    [provider]  ibuprofen (ADVIL,MOTRIN) 600 MG tablet Take 1 tablet (600 mg total) by mouth every 8 (eight) hours as needed for fever, mild pain or moderate pain. 08/31/17   Olean Ree, MD  ondansetron (ZOFRAN ODT) 4 MG disintegrating tablet Allow 1-2 tablets to dissolve in your mouth every 8 hours as needed for nausea/vomiting 11/18/17   Hinda Kehr, MD  PROCTOSOL HC 2.5 % rectal cream APPLY TO AFFECTED AREA UP TO 3 TO 4 TIMES DAILY FOR HEMORRHOIDS (SPANISH INSTRUC) 10/22/17   [provider]  Pseudoeph-Doxylamine-DM-APAP (NYQUIL PO) Take 15-30 mLs by mouth at bedtime as needed (FOR COUGH AT NIGHT).    [provider]    No Known Allergies  Family History  Problem Relation Age of Onset  . Diabetes Mother   . Gallbladder disease Mother   . Hypertension Mother   . Breast cancer Neg Hx   . Cancer Neg Hx     Social History Social History   Tobacco Use  . Smoking status: Never Smoker  . Smokeless tobacco: Never Used  Substance Use Topics  . Alcohol use: No    Frequency: Never  . Drug use: No    Review of Systems Constitutional: Intermittent fever Eyes: Negative for visual complaints ENT: Negative for recent illness/congestion Cardiovascular: Negative for chest pain. Respiratory: Negative for shortness of breath.  Negative for cough.   Gastrointestinal: Mild lower abdominal discomfort.  Nausea with one episode of vomiting.  Negative for diarrhea Genitourinary: Positive for urinary burning, mild. Musculoskeletal: Negative for musculoskeletal complaints Skin:  Negative for skin complaints  Neurological: Negative for headache All other ROS negative  ____________________________________________   PHYSICAL EXAM:  VITAL SIGNS: ED Triage Vitals [11/19/17 1156]  Enc Vitals Group     BP (!) 101/57     Pulse Rate 78     Resp 18     Temp 98.7 F (37.1 C)     Temp Source Oral     SpO2 97 %     Weight 209 lb (94.8 kg)     Height 4\' 11"  (1.499 m)     Head  Circumference      Peak Flow      Pain Score 0     Pain Loc      Pain Edu?      Excl. in [place]?     Constitutional: Alert and oriented. Well appearing and in no distress. Eyes: Normal exam ENT   Head: Normocephalic and atraumatic.   Mouth/Throat: Mucous membranes are moist. Cardiovascular: Normal rate, regular rhythm. No murmur Respiratory: Normal respiratory effort without tachypnea nor retractions. Breath sounds are clear Gastrointestinal: Soft, mild suprapubic and left lower quadrant tenderness, no rebound or guarding or distention. Musculoskeletal: Nontender with normal range of motion in all extremities.  Neurologic:  Normal speech and language. No gross focal neurologic deficits Skin:  Skin is warm, dry and intact.  Psychiatric: Mood and affect are normal.  ____________________________________________   INITIAL IMPRESSION / ASSESSMENT AND PLAN / ED COURSE  Pertinent labs & imaging results that were available during my care of the patient were reviewed by me and considered in my medical decision making (see chart for details).  Patient presents to the emergency department for positive blood cultures.  I discussed with the patient as both the anaerobic bottles are positive the patient will require admission to the hospital for IV antibiotics for presumed bacteremia.  Likely urinary tract infection which became hematologic.  We will check labs including a new set of blood cultures, urinalysis with a new urine culture, start IV antibiotics and admit to the hospitalist service.  Patient agreeable to plan of care.  Patient was dosed antibiotics in the emergency department yesterday, states she did not yet fill her prescription today.  ____________________________________________   FINAL CLINICAL IMPRESSION(S) / ED DIAGNOSES  Urinary tract infection Bacteremia    Harvest Dark, MD 11/19/17 1327

## 2017-11-20 LAB — CBC
HEMATOCRIT: 34.1 % — AB (ref 35.0–47.0)
Hemoglobin: 11.9 g/dL — ABNORMAL LOW (ref 12.0–16.0)
MCH: 30.6 pg (ref 26.0–34.0)
MCHC: 34.9 g/dL (ref 32.0–36.0)
MCV: 87.9 fL (ref 80.0–100.0)
Platelets: 208 10*3/uL (ref 150–440)
RBC: 3.88 MIL/uL (ref 3.80–5.20)
RDW: 15.2 % — ABNORMAL HIGH (ref 11.5–14.5)
WBC: 10.3 10*3/uL (ref 3.6–11.0)

## 2017-11-20 LAB — BASIC METABOLIC PANEL
Anion gap: 5 (ref 5–15)
BUN: 12 mg/dL (ref 6–20)
CALCIUM: 8 mg/dL — AB (ref 8.9–10.3)
CO2: 24 mmol/L (ref 22–32)
Chloride: 112 mmol/L — ABNORMAL HIGH (ref 98–111)
Creatinine, Ser: 0.73 mg/dL (ref 0.44–1.00)
GFR calc Af Amer: >60 mL/min (ref >60)
GLUCOSE: 117 mg/dL — AB (ref 70–99)
Potassium: 3.2 mmol/L — ABNORMAL LOW (ref 3.5–5.1)
Sodium: 141 mmol/L (ref 135–145)

## 2017-11-20 LAB — ECHOCARDIOGRAM COMPLETE
Height: 59 in
Weight: 3305.6 oz

## 2017-11-20 LAB — GLUCOSE, CAPILLARY: Glucose-Capillary: 134 mg/dL — ABNORMAL HIGH (ref 70–99)

## 2017-11-20 LAB — MAGNESIUM: MAGNESIUM: 2 mg/dL (ref 1.7–2.4)

## 2017-11-20 LAB — URINE CULTURE

## 2017-11-20 LAB — HEMOGLOBIN A1C
HEMOGLOBIN A1C: 5.9 % — AB (ref 4.8–5.6)
Mean Plasma Glucose: 122.63 mg/dL

## 2017-11-20 MED ORDER — POTASSIUM CHLORIDE CRYS ER 20 MEQ PO TBCR
40.0000 meq | EXTENDED_RELEASE_TABLET | Freq: Once | ORAL | Status: AC
Start: 2017-11-20 — End: 2017-11-20
  Administered 2017-11-20: 07:00:00 40 meq via ORAL
  Filled 2017-11-20: qty 2

## 2017-11-20 NOTE — Progress Notes (Addendum)
Jaime Brock at [place] was admitted to the Hospital on 11/19/2017 and will be discharged in 2 days and should be excused from work/school   for 7  days starting 11/19/2017 , may return to work/school without any restrictions.  [name] M.D on 11/20/2017,at 9:32 AM  [place] at [facility]  [phone]

## 2017-11-20 NOTE — Progress Notes (Signed)
[place] at [place] NAME: Jaime Brock    MR#:  [id number]  DATE OF BIRTH:  [birth year]  SUBJECTIVE:  CHIEF COMPLAINT:   Chief Complaint  Patient presents with  . Blood Infection   The patient has no complaints. REVIEW OF SYSTEMS:  Review of Systems  Constitutional: Negative for chills, fever and malaise/fatigue.  HENT: Negative for sore throat.   Eyes: Negative for blurred vision and double vision.  Respiratory: Negative for cough, hemoptysis, shortness of breath, wheezing and stridor.   Cardiovascular: Negative for chest pain, palpitations, orthopnea and leg swelling.  Gastrointestinal: Negative for abdominal pain, blood in stool, diarrhea, melena, nausea and vomiting.  Genitourinary: Negative for dysuria, flank pain and hematuria.  Musculoskeletal: Negative for back pain and joint pain.  Skin: Negative for rash.  Neurological: Positive for headaches. Negative for dizziness, sensory change, focal weakness, seizures, loss of consciousness and weakness.  Endo/Heme/Allergies: Negative for polydipsia.  Psychiatric/Behavioral: Negative for depression. The patient is not nervous/anxious.     DRUG ALLERGIES:  No Known Allergies VITALS:  Blood pressure 125/70, pulse 82, temperature 99.3 F (37.4 C), temperature source Oral, resp. rate 20, height 4\' 11"  (1.499 m), weight 214 lb 8 oz (97.3 kg), last menstrual period 08/17/2017, SpO2 98 %. PHYSICAL EXAMINATION:  Physical Exam  Constitutional: She is oriented to person, place, and time. She appears well-developed.  Morbid obesity.  HENT:  Head: Normocephalic.  Mouth/Throat: Oropharynx is clear and moist.  Eyes: Pupils are equal, round, and reactive to light. Conjunctivae and EOM are normal. No scleral icterus.  Neck: Normal range of motion. Neck supple. No JVD present. No tracheal deviation present.  Cardiovascular: Normal rate, regular rhythm and normal heart sounds. Exam reveals no  gallop.  No murmur heard. Pulmonary/Chest: Effort normal and breath sounds normal. No respiratory distress. She has no wheezes. She has no rales.  Abdominal: Soft. Bowel sounds are normal. She exhibits no distension. There is no tenderness. There is no rebound.  Musculoskeletal: Normal range of motion. She exhibits no edema or tenderness.  Neurological: She is alert and oriented to person, place, and time. No cranial nerve deficit.  Skin: No rash noted. No erythema.  Psychiatric: She has a normal mood and affect.   LABORATORY PANEL:  Female CBC Recent Labs  Lab 11/20/17 0526  WBC 10.3  HGB 11.9*  HCT 34.1*  PLT 208   ------------------------------------------------------------------------------------------------------------------ Chemistries  Recent Labs  Lab 11/18/17 1952  11/20/17 0525 11/20/17 0526  NA 141   < >  --  141  K 3.4*   < >  --  3.2*  CL 110   < >  --  112*  CO2 22   < >  --  24  GLUCOSE 138*   < >  --  117*  BUN 20   < >  --  12  CREATININE 0.90   < >  --  0.73  CALCIUM 9.0   < >  --  8.0*  MG  --   --  2.0  --   AST 28  --   --   --   ALT 16  --   --   --   ALKPHOS 90  --   --   --   BILITOT 0.8  --   --   --    < > = values in this interval not displayed.   RADIOLOGY:  No results found. ASSESSMENT AND PLAN:  52 year old female patient with no significant past medical history comes in with the fever, abdominal pain, chills, discharged from ER yesterday for UTI and given Keflex and comes back today because of call from your ER for blood infection.  1, Sepsis with Ecoli and enterococccal bacteremia; Continue IV meropenem IV. Blood cultures start showing Enterobacter and ecoli bacteremia; echocardiogram is pending.  Follow-up epeated culture.  Leukocytosis improved.  2. Hypokalemia;replaced K and follow-up level.  3. Morbid obesity.  All the discussion was via help of Spanish interpreter. All the records are reviewed and case discussed with Care  Management/Social Worker. Management plans discussed with the patient, family members and they are in agreement.  CODE STATUS: Full Code  TOTAL TIME TAKING CARE OF THIS PATIENT: 35 minutes.   More than 50% of the time was spent in counseling/coordination of care: YES  POSSIBLE D/C IN 2 DAYS, DEPENDING ON CLINICAL CONDITION.   [name] M.D on 11/20/2017 at 12:57 PM  Between 7am to 6pm - Pager - [phone]  After 6pm go to [email or web address] - [place] [facility]

## 2017-11-20 NOTE — Progress Notes (Signed)
Reports abdominal/ back pain resolved. Afebrile. Tylenol x 1 effective for HA. Son interprets for pt with nurse; pt declined interpreter for nurse. Tolerating diet well without nausea. IV antibiotics and fluids continued.

## 2017-11-21 LAB — BASIC METABOLIC PANEL
Anion gap: 6 (ref 5–15)
BUN: 12 mg/dL (ref 6–20)
CO2: 24 mmol/L (ref 22–32)
Calcium: 8.6 mg/dL — ABNORMAL LOW (ref 8.9–10.3)
Chloride: 113 mmol/L — ABNORMAL HIGH (ref 98–111)
Creatinine, Ser: 0.67 mg/dL (ref 0.44–1.00)
Glucose, Bld: 126 mg/dL — ABNORMAL HIGH (ref 70–99)
POTASSIUM: 3.7 mmol/L (ref 3.5–5.1)
SODIUM: 143 mmol/L (ref 135–145)

## 2017-11-21 LAB — CULTURE, BLOOD (ROUTINE X 2): SPECIAL REQUESTS: ADEQUATE

## 2017-11-21 LAB — URINE CULTURE: Culture: >=100000 — AB

## 2017-11-21 LAB — GLUCOSE, CAPILLARY: GLUCOSE-CAPILLARY: 100 mg/dL — AB (ref 70–99)

## 2017-11-21 MED ORDER — CEPHALEXIN 500 MG PO CAPS: 500.0000 mg | ORAL_CAPSULE | Freq: Three times a day (TID) | ORAL | Status: DC

## 2017-11-21 MED ORDER — CEPHALEXIN 500 MG PO CAPS
500.0000 mg | ORAL_CAPSULE | Freq: Three times a day (TID) | ORAL | Status: DC
  Administered 2017-11-21: 500 mg via ORAL
  Filled 2017-11-21: qty 1

## 2017-11-21 MED ORDER — CEPHALEXIN 500 MG PO CAPS: 500.0000 mg | ORAL_CAPSULE | Freq: Two times a day (BID) | ORAL | Status: DC

## 2017-11-21 MED ORDER — CEPHALEXIN 500 MG PO CAPS: 500.0000 mg | ORAL_CAPSULE | Freq: Three times a day (TID) | ORAL | 0 refills | Status: AC

## 2017-11-21 NOTE — Discharge Summary (Signed)
[place] [facility] [place] NAME: Jaime Brock    MR#:  [id number]  DATE OF BIRTH:  01/16/1966  DATE OF ADMISSION:  11/19/2017   ADMITTING PHYSICIAN: Epifanio Lesches, MD  DATE OF DISCHARGE:  11/21/2017 PRIMARY CARE PHYSICIAN: [facility] [place]   ADMISSION DIAGNOSIS:  Bacteremia [R78.81] Urinary tract infection without hematuria, site unspecified [N39.0] DISCHARGE DIAGNOSIS:  Active Problems:   Sepsis ([place])  SECONDARY DIAGNOSIS:   Past Medical History:  Diagnosis Date  . 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  1, Sepsis with Ecoli and enterococccal bacteremia; The patient was treated with IVmeropenem IV.Blood cultures showing Enterobacter and ecoli bacteremia;echocardiogram is unremarkable. Leukocytosis improved.  Both blood culture and urine cultures show E. coli, sensitive to multiple antibiotics.  Change to Keflex p.o. 3 times daily for 10 more days.  2. Hypokalemia;replaced K and improved.  3. Morbid obesity.  All the discussion was via help of Spanish interpreter. DISCHARGE CONDITIONS:  Stable, discharge to home today. CONSULTS OBTAINED:   DRUG ALLERGIES:  No Known Allergies DISCHARGE MEDICATIONS:   Allergies as of 11/21/2017   No Known Allergies     Medication List    TAKE these medications   cephALEXin 500 MG capsule Commonly known as:  KEFLEX Take 1 capsule (500 mg total) by mouth 3 (three) times daily for 10 days.   ibuprofen 600 MG tablet Commonly known as:  ADVIL,MOTRIN Take 1 tablet (600 mg total) by mouth every 8 (eight) hours as needed for fever, mild pain or moderate pain.   ondansetron 4 MG disintegrating  tablet Commonly known as:  ZOFRAN ODT Allow 1-2 tablets to dissolve in your mouth every 8 hours as needed for nausea/vomiting        DISCHARGE INSTRUCTIONS:  See AVS.  If you experience worsening of your admission symptoms, develop shortness of breath, life threatening emergency, suicidal or homicidal thoughts you must seek medical attention immediately by calling 911 or calling your MD immediately  if symptoms less severe.  You Must read complete instructions/literature along with all the possible adverse reactions/side effects for all the Medicines you take and that have been prescribed to you. Take any new Medicines after you have completely understood and accpet all the possible adverse reactions/side effects.   Please note  You were cared for by a hospitalist during your hospital stay. If you have any questions about your discharge medications or the care you received while you were in the hospital after you are discharged, you can call the unit and asked to speak with the hospitalist on call if the hospitalist that took care of you is not available. Once you are discharged, your primary care physician will handle any further medical issues. Please note that NO REFILLS for any discharge medications will be authorized once you are discharged, as it is imperative that you return to your primary care physician (or establish a relationship with a primary care physician if you do not have one) for your aftercare needs so that they can reassess your need for medications and monitor your lab values.    On the day of Discharge:  VITAL SIGNS:  Blood pressure 113/63, pulse 65, temperature 98.4 F (36.9 C), temperature source Oral,  resp. rate 18, height 4\' 11"  (1.499 m), weight 222 lb 8 oz (100.9 kg), last menstrual period 08/17/2017, SpO2 99 %. PHYSICAL EXAMINATION:  GENERAL:  52 y.o.-year-old patient lying in the bed with no acute distress.  Morbid obesity. EYES: Pupils equal, round, reactive  to light and accommodation. No scleral icterus. Extraocular muscles intact.  HEENT: Head atraumatic, normocephalic. Oropharynx and nasopharynx clear.  NECK:  Supple, no jugular venous distention. No thyroid enlargement, no tenderness.  LUNGS: Normal breath sounds bilaterally, no wheezing, rales,rhonchi or crepitation. No use of accessory muscles of respiration.  CARDIOVASCULAR: S1, S2 normal. No murmurs, rubs, or gallops.  ABDOMEN: Soft, non-tender, non-distended. Bowel sounds present. No organomegaly or mass.  EXTREMITIES: No pedal edema, cyanosis, or clubbing.  NEUROLOGIC: Cranial nerves II through XII are intact. Muscle strength 5/5 in all extremities. Sensation intact. Gait not checked.  PSYCHIATRIC: The patient is alert and oriented x 3.  SKIN: No obvious rash, lesion, or ulcer.  DATA REVIEW:   CBC Recent Labs  Lab 11/20/17 0526  WBC 10.3  HGB 11.9*  HCT 34.1*  PLT 208    Chemistries  Recent Labs  Lab 11/18/17 1952  11/20/17 0525  11/21/17 0455  NA 141   < >  --    < > 143  K 3.4*   < >  --    < > 3.7  CL 110   < >  --    < > 113*  CO2 22   < >  --    < > 24  GLUCOSE 138*   < >  --    < > 126*  BUN 20   < >  --    < > 12  CREATININE 0.90   < >  --    < > 0.67  CALCIUM 9.0   < >  --    < > 8.6*  MG  --   --  2.0  --   --   AST 28  --   --   --   --   ALT 16  --   --   --   --   ALKPHOS 90  --   --   --   --   BILITOT 0.8  --   --   --   --    < > = values in this interval not displayed.     Microbiology Results  Results for orders placed or performed during the hospital encounter of 11/19/17  Blood culture (routine x 2)     Status: None (Preliminary result)   Collection Time: 11/19/17 12:43 PM  Result Value Ref Range Status   Specimen Description BLOOD RT FA  Final   Special Requests   Final    BOTTLES DRAWN AEROBIC AND ANAEROBIC Blood Culture results may not be optimal due to an excessive volume of blood received in culture bottles   Culture   Final    NO  GROWTH 2 DAYS Performed at [facility], [address], [place], [place] [address]    Report Status PENDING  Incomplete  Urine culture     Status: Abnormal   Collection Time: 11/19/17 12:43 PM  Result Value Ref Range Status   Specimen Description   Final    URINE, RANDOM Performed at [facility] [address] [place], [place] [address]    Special Requests   Final    NONE Performed at [facility], [address] [place], [place] [address]    Culture (A)  Final    <10,000 COLONIES/mL INSIGNIFICANT GROWTH Performed at [facility] [address], [place], [place] [address]    Report Status 11/20/2017 FINAL  Final  Blood culture (routine x 2)     Status: None (Preliminary result)   Collection Time: 11/19/17 12:59 PM  Result Value Ref Range Status   Specimen Description BLOOD [facility]  Final   Special Requests   Final    BOTTLES DRAWN AEROBIC AND ANAEROBIC Blood Culture adequate volume   Culture   Final    NO GROWTH 2 DAYS Performed at [facility], [address] [place], [place] [address]    Report Status PENDING  Incomplete    RADIOLOGY:  No results found.   Management plans discussed with the patient, family and they are in agreement.  CODE STATUS: Full Code   TOTAL TIME TAKING CARE OF THIS PATIENT: 33 minutes.    [name] M.D on 11/21/2017 at 1:09 PM  Between 7am to 6pm - Pager - [phone]  After 6pm go to [email or web address] - Technical brewer [facility]  Office  [phone]  CC: Primary care physician; [facility] [place]   Note: This dictation was prepared with Diplomatic Services operational officer dictation along with smaller phrase technology. Any transcriptional errors that result from this process are unintentional.

## 2017-11-21 NOTE — Plan of Care (Signed)
  Problem: Spiritual Needs Goal: Ability to function at adequate level Outcome: Progressing   Problem: Education: Goal: Knowledge of General Education information will improve Outcome: Progressing   Problem: Health Behavior/Discharge Planning: Goal: Ability to manage health-related needs will improve Outcome: Progressing   Problem: Clinical Measurements: Goal: Ability to maintain clinical measurements within normal limits will improve Outcome: Progressing Goal: Will remain free from infection Outcome: Progressing Goal: Diagnostic test results will improve Outcome: Progressing   Problem: Nutrition: Goal: Adequate nutrition will be maintained Outcome: Progressing   Problem: Elimination: Goal: Will not experience complications related to bowel motility Outcome: Progressing   Problem: Pain Managment: Goal: General experience of comfort will improve Outcome: Progressing   Problem: Safety: Goal: Ability to remain free from injury will improve Outcome: Progressing

## 2017-11-21 NOTE — Progress Notes (Signed)
Pt for discharge home . A/o no resp distress. No voiced c/o. Discharge instructions given to pt via interpeter.regarding  meds / diet  Activity and f/u. presc given and discussed. Verbalizes understanding of discharge instructions.out at this time via w/c.sl d/cd.copy of work note given to pt.

## 2017-11-22 LAB — HIV ANTIBODY (ROUTINE TESTING W REFLEX): HIV SCREEN 4TH GENERATION: NONREACTIVE

## 2017-11-24 ENCOUNTER — Telehealth: Payer: Self-pay

## 2017-11-24 LAB — CULTURE, BLOOD (ROUTINE X 2)
CULTURE: NO GROWTH
CULTURE: NO GROWTH
SPECIAL REQUESTS: ADEQUATE

## 2017-11-24 NOTE — Telephone Encounter (Signed)
EMMI Follow-up: Noted on the report patient wasn't sure who to contact if there was a change in condition.  I called and patient doesn't speak English (even though it was noted on the report Call language was [place]).  She said I could speak with her son, Jaime Brock.  I talked with Jaime Brock and explained I was calling as a follow-up to the first of 2 automated calls.  I asked if they had her discharge paper work showing follow-up appointments and he said yes.  I let him know she should call [facility] if she had any change in her condition prior to follow-up appointment on 7/22 and he said he would let her know.  Son said she was doing well and no other needs noted.

## 2018-01-30 ENCOUNTER — Ambulatory Visit: Payer: Commercial Managed Care - PPO | Admitting: Gastroenterology

## 2018-01-30 ENCOUNTER — Encounter: Payer: Self-pay | Admitting: Gastroenterology

## 2018-01-30 ENCOUNTER — Other Ambulatory Visit: Payer: Self-pay

## 2018-01-30 VITALS — BP 101/66 | HR 78 | Resp 16 | Ht 59.0 in | Wt 206.6 lb

## 2018-01-30 DIAGNOSIS — K640 First degree hemorrhoids: Secondary | ICD-10-CM

## 2018-01-30 NOTE — Progress Notes (Signed)
Cephas Darby, MD [address]  [place]  [place], [place] [address]  Main: [phone]  Fax: [phone]    Gastroenterology Consultation  Referring Provider:     [facility] [place] Physician:  [facility] [place] Primary Gastroenterologist:  Dr. Cephas Darby Reason for Consultation:     Follow-up of appendiceal mucinous neoplasm        HPI:   Jaime Brock is a 52 y.o. female referred by Dr. Domingo Madeira, [place]  for consultation & management of follow-up of appendiceal mucinous neoplasm. I initially met the patient in 06/2017 for screening colonoscopy and found to have a 10 mm polypoid lesion in the appendiceal orifice which was submucosal. I referred her to general surgery for right hemicolectomy which she underwent in 08/2017. The surgical pathology turned out to be low-grade appendiceal mucinous neoplasm with surgical margins negative. She did not have any complications postoperatively.  She reports new onset of constipation since surgery for which she tried Colace, Dulcolax which she did not provide much benefit. She also reports that her hemorrhoids are inflamed and has been taking ibuprofen for the rectal discomfort.  Follow up visit 01/30/2018 Patient reports that her constipation has improved with MiraLAX. She no longer has symptoms from hemorrhoids. She used 2 weeks course of Anusol. She hasn't tried high fiber diet yet. She denies any GI symptoms today  NSAIDs: ibuprofen  Antiplts/Anticoagulants/Anti thrombotics: none  GI Procedures: Colonoscopy 07/07/2017  A 10 mm polypoid lesion was found appendiceal orifice. The lesion was polypoid and submucosal. No bleeding was present. A 7 mm polyp was found in the descending colon. The polyp was semi-pedunculated. The polyp was removed with a hot snare. Resection and retrieval were complete. DIAGNOSIS:  A. COLON POLYP, DESCENDING; HOT SNARE:  - TUBULAR ADENOMA.   - NEGATIVE FOR HIGH-GRADE DYSPLASIA AND MALIGNANCY. DIAGNOSIS:  A. APPENDIX AND CECUM; PARTIAL CECECTOMY AND APPENDECTOMY:  - LOW-GRADE APPENDICEAL MUCINOUS NEOPLASM (LAMN).  - SESSILE SERRATED ADENOMA.  - SURGICAL MARGINS APPEAR NEGATIVE.    Past Medical History:  Diagnosis Date  . 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 CHOLECYSTECTOMY    . COLONOSCOPY WITH PROPOFOL N/A 07/07/2017   Procedure: COLONOSCOPY WITH PROPOFOL;  Surgeon: Lin Landsman, MD;  [facility] END[facility]COPY;  Service: Gastroenterology;  Laterality: N/A;  . LAPAROSCOPIC APPENDECTOMY N/A 08/31/2017   Procedure: APPENDECTOMY LAPAROSCOPIC;  Surgeon: Olean Ree, MD;  Location: [facility] ORS;  Service: General;  Laterality: N/A;  . TUBAL LIGATION      Current Outpatient Medications:  .  ibuprofen (ADVIL,MOTRIN) 600 MG tablet, Take 1 tablet (600 mg total) by mouth every 8 (eight) hours as needed for fever, mild pain or moderate pain., Disp: 30 tablet, Rfl: 0 .  cefdinir (OMNICEF) 300 MG capsule, 1 BY MOUTH TWICE A DAY FOR 7D, Disp: , Rfl: 0 .  ciprofloxacin (CIPRO) 500 MG tablet, TAKE 1 TABLET BY MOUTH TWICE A DAY FOR 7 DAYS, Disp: , Rfl: 0 .  ondansetron (ZOFRAN ODT) 4 MG disintegrating tablet, Allow 1-2 tablets to dissolve in your mouth every 8 hours as needed for nausea/vomiting (Patient not taking: Reported on 11/19/2017), Disp: 30 tablet, Rfl: 0  Family History  Problem Relation Age of Onset  . Diabetes Mother   . Gallbladder disease Mother   . Hypertension Mother   . Breast cancer Neg Hx   . Cancer Neg Hx  Social History   Tobacco Use  . Smoking status: Never Smoker  . Smokeless tobacco: Never Used  Substance Use Topics  . Alcohol use: No    Frequency: Never  . Drug use: No    Allergies as of 01/30/2018  . (No Known Allergies)    Review of Systems:    All systems reviewed and negative except where noted in HPI.   Physical  Exam:  BP 101/66 (BP Location: Left Arm, Patient Position: Sitting, Cuff Size: Large)   Pulse 78   Resp 16   Ht '4\' 11"'$  (1.499 m)   Wt 206 lb 9.6 oz (93.7 kg)   LMP 08/17/2017 (Approximate) Comment: Pt denies any chance of preg, pt does not wnat to take preg test.   BMI 41.73 kg/m  Patient's last menstrual period was 08/17/2017 (approximate).  General:   Alert, morbidly obese, short stature, Well-developed, well-nourished, pleasant and cooperative in NAD Head:  Normocephalic and atraumatic. Eyes:  Sclera clear, no icterus.   Conjunctiva pink. Ears:  Normal auditory acuity. Nose:  No deformity, discharge, or lesions. Mouth:  No deformity or lesions,oropharynx pink & moist. Neck:  Supple; no masses or thyromegaly. Lungs:  Respirations even and unlabored.  Clear throughout to auscultation.   No wheezes, crackles, or rhonchi. No acute distress. Heart:  Regular rate and rhythm; no murmurs, clicks, rubs, or gallops. Abdomen:  Normal bowel sounds. Soft, non-tender and non-distended without masses, hepatosplenomegaly or hernias noted.  No guarding or rebound tenderness.   Rectal: Not performed Msk:  Symmetrical without gross deformities. Good, equal movement & strength bilaterally. Pulses:  Normal pulses noted. Extremities:  No clubbing or edema.  No cyanosis. Neurologic:  Alert and oriented x3;  grossly normal neurologically. Skin:  Intact without significant lesions or rashes. No jaundice. Lymph Nodes:  No significant cervical adenopathy. Psych:  Alert and cooperative. Normal mood and affect.  Imaging Studies: reviewed  Assessment and Plan:   Jaime Brock is a 52 y.o. 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 Currently, symptoms resolved  - Reiterated to patient about high-fiber diet - continue MiraLAX one to 2 times  daily  Mucinous neoplasm of the appendix, status post right hemicolectomy Tubular adenomas of the colon Recommend colonoscopy in 1 year for surveillance which will be 06/2018   Follow up as needed   Cephas Darby, MD

## 2019-03-26 ENCOUNTER — Other Ambulatory Visit: Payer: Self-pay

## 2019-03-26 ENCOUNTER — Ambulatory Visit
Admission: RE | Admit: 2019-03-26 | Discharge: 2019-03-26 | Disposition: A | Discharge status: Home / Self Care | Payer: Commercial Managed Care - PPO | Source: Ambulatory Visit | Attending: Family Medicine | Admitting: Family Medicine

## 2019-03-26 ENCOUNTER — Ambulatory Visit
Admission: RE | Admit: 2019-03-26 | Discharge: 2019-03-26 | Disposition: A | Discharge status: Home / Self Care | Payer: Commercial Managed Care - PPO | Attending: Family Medicine | Admitting: Family Medicine

## 2019-03-26 ENCOUNTER — Other Ambulatory Visit: Payer: Self-pay | Admitting: Family Medicine

## 2019-03-26 DIAGNOSIS — M170 Bilateral primary osteoarthritis of knee: Secondary | ICD-10-CM

## 2019-04-16 ENCOUNTER — Other Ambulatory Visit: Payer: Self-pay | Admitting: Family Medicine

## 2019-04-16 DIAGNOSIS — Z1231 Encounter for screening mammogram for malignant neoplasm of breast: Secondary | ICD-10-CM

## 2019-09-30 IMAGING — CT CT ENTEROGRAPHY (ABD-PELV W/ CM)
1 of 4 series · 13 of 32 positions shown, 18 images · IV contrast (APPLIED)
Comparison: None.

CLINICAL DATA: Polypoid lesion at the appendiceal orifice on
colonoscopy, for further investigation.

EXAM:
CT ABDOMEN AND PELVIS WITH CONTRAST (ENTEROGRAPHY)
TECHNIQUE: Multidetector CT of the abdomen and pelvis during bolus
administration of intravenous contrast. Negative oral contrast was
given.
CONTRAST:  100mL [id number] IOPAMIDOL ([id number]) INJECTION 61%

[Series 3: entero thins · axial · 0.80mm/px · z∈[-450,-34]mm · 13 of 235 slices shown, 18 images]
[im 14/235  soft-tissue]
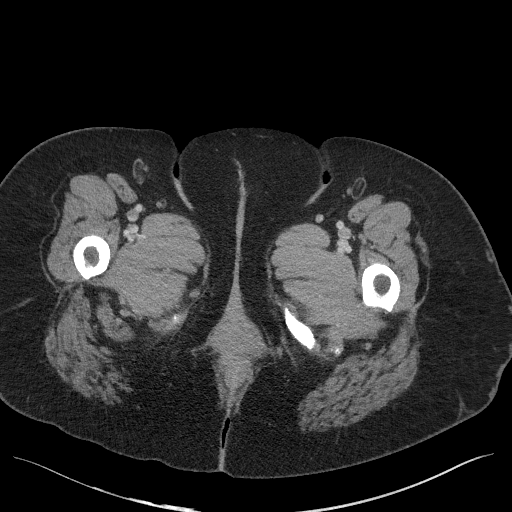
[im 14/235  bone]
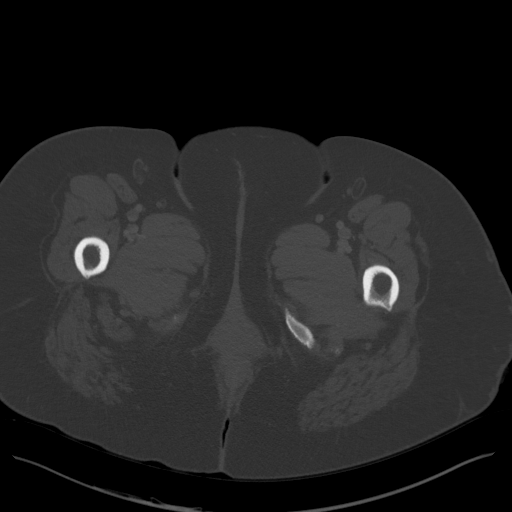
[im 40/235  soft-tissue]
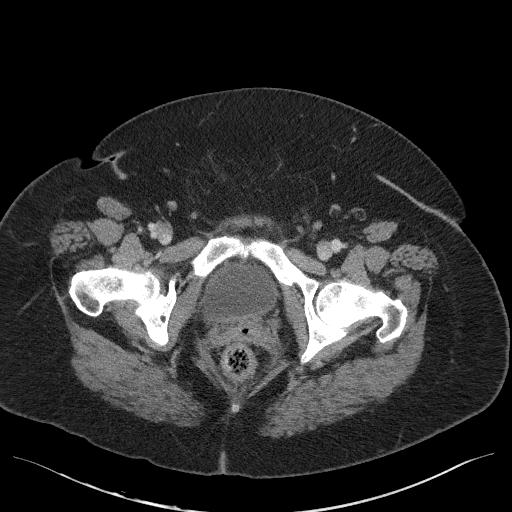
[im 53/235  soft-tissue]
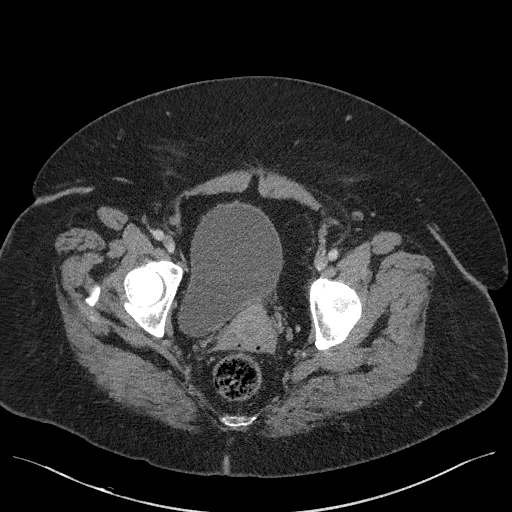
[im 66/235  soft-tissue]
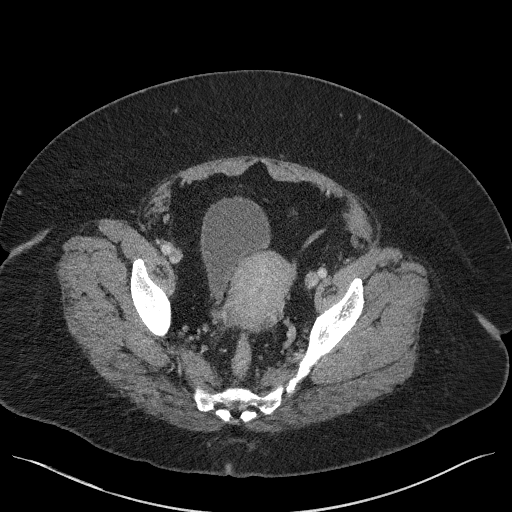
[im 92/235  soft-tissue]
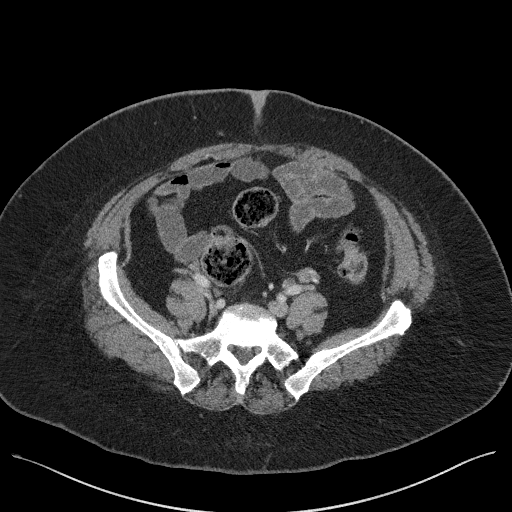
[im 105/235  soft-tissue]
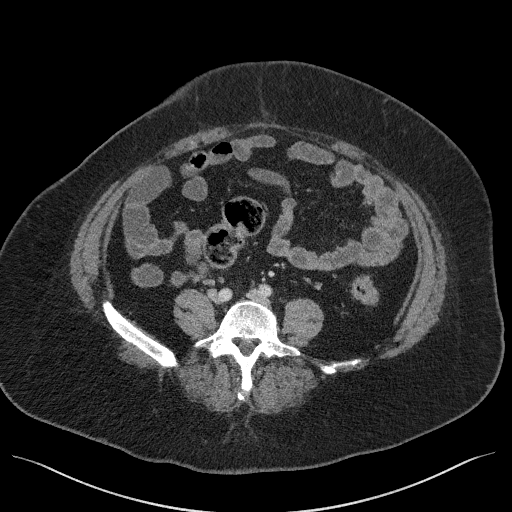
[im 131/235  soft-tissue]
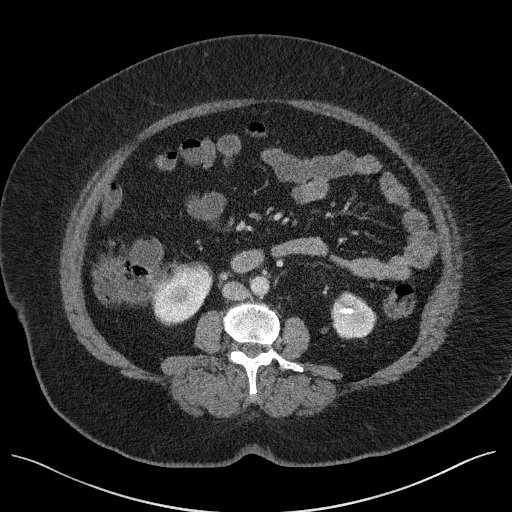
[im 144/235  soft-tissue]
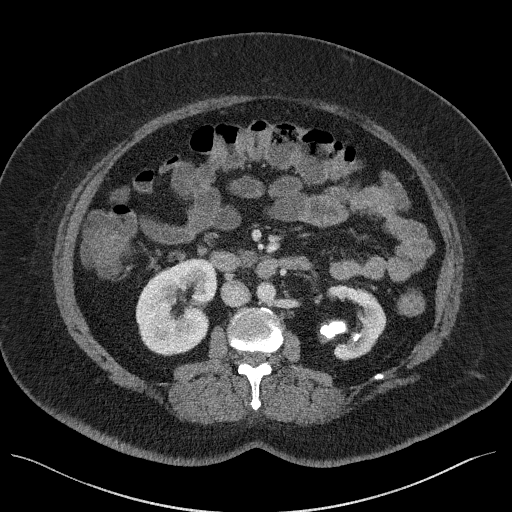
[im 170/235  soft-tissue]
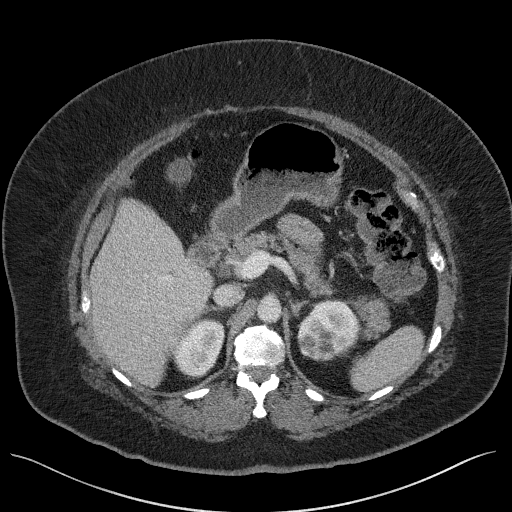
[im 170/235  bone]
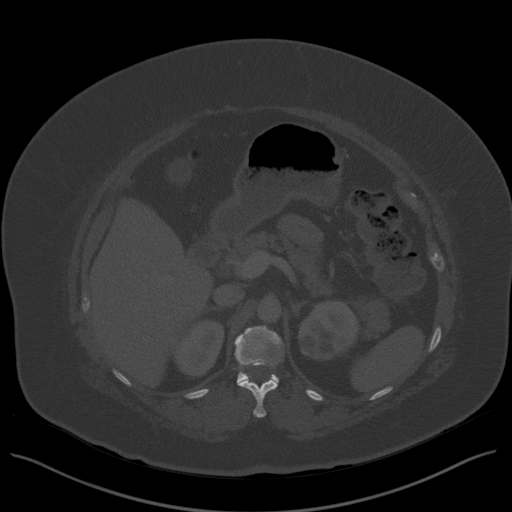
[im 183/235  soft-tissue]
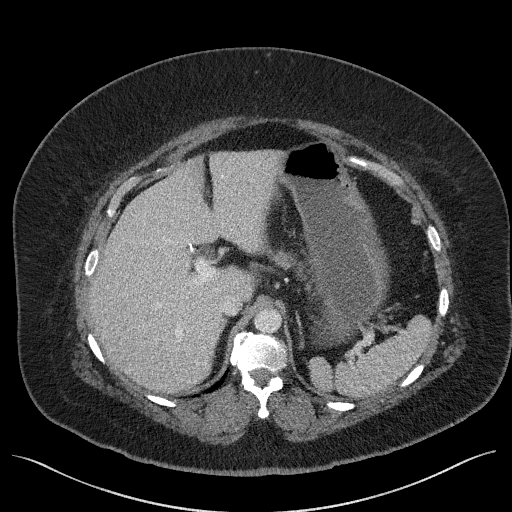
[im 183/235  lung]
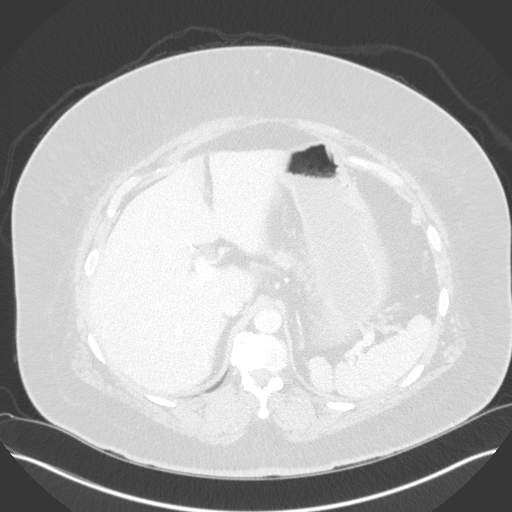
[im 196/235  soft-tissue]
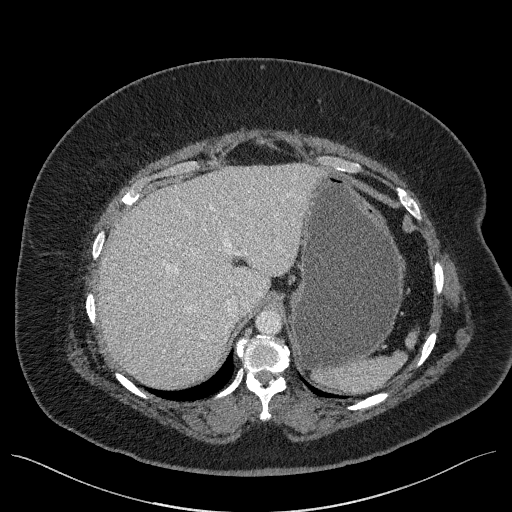
[im 196/235  lung]
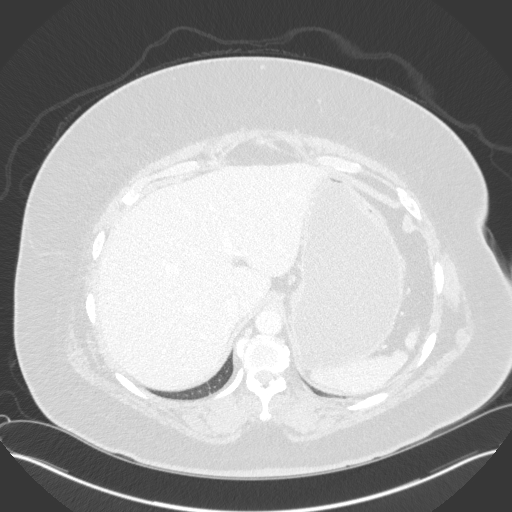
[im 209/235  lung]
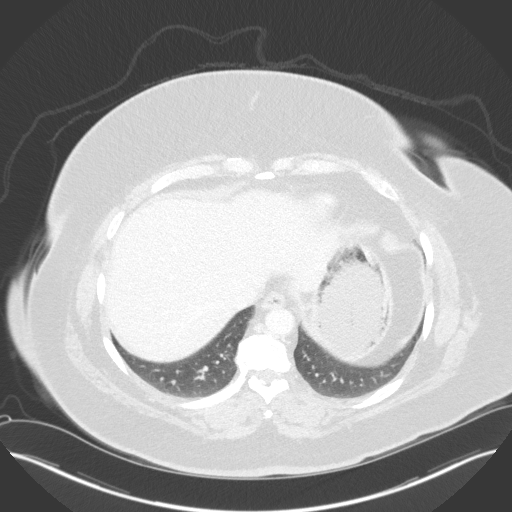
[im 222/235  soft-tissue]
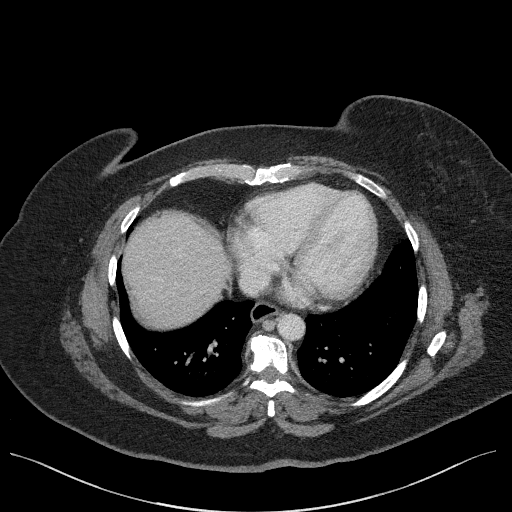
[im 222/235  lung]
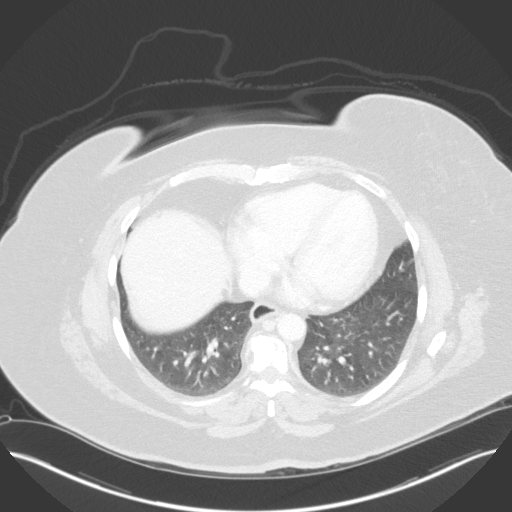

[13 of 32 positions shown; findings below may reference images not displayed]

FINDINGS: Body habitus reduces diagnostic sensitivity and specificity.

Lower chest:  Unremarkable

Hepatobiliary: Cholecystectomy. Common hepatic duct at 8 mm in
diameter, probably a physiologic response to cholecystectomy.

Pancreas: Unremarkable

Spleen: Unremarkable

Adrenals/Urinary Tract: Adrenal glands normal.

3.4 cm branching staghorn calculus in the left kidney lower pole
extending to the UPJ, with mild associated hydronephrosis and with
scarring in the upper pole and lower pole associated with calyceal
diverticula.

Stomach/Bowel: The appendix is dilated up to 10 mm in diameter on
image 68/6. Suspected appendicolith noted with a 1.3 cm in length
primarily linear calcific density in the appendix on image 122/3,
and a smaller punctate calcific density near the appendiceal tip on
image 121/3. The appendiceal orifice is blurred by motion artifact
on images 102-104 of series 3, and is immediately adjacent to the
lower pole of the right kidney. The area is somewhat better seen on
image 37/8, and the possibility of a 1.5 by 1.2 cm lesion along the
appendiceal orifice is raised on this image.

No additional bowel abnormality is identified.

Vascular/Lymphatic: No pathologic adenopathy is identified.
Scattered mostly small pericecal lymph nodes are present.

Reproductive: Unremarkable

Other: No supplemental non-categorized findings.

Musculoskeletal: Anterior spurring of the sacroiliac joints.

Chondrocalcinosis in the pubic symphysis.

Lower lumbar degenerative disc disease and mild spondylosis without
definite impingement.
IMPRESSION: 1. Abnormal caliber of the appendix at 10 mm as measured on image
68/6, with prominence of the appendix near the orifice from the
cecum better seen on the delayed phase images. Appearance is not
entirely specific, but given the lack of appreciable enhancement
favors appendiceal mucocele. There is also a more distal
appendicolith. Appendectomy should be considered.
2. Large staghorn calculus of the left kidney lower pole extending
to the UPJ, with associated scarring/atrophy in the left kidney and
notable left renal calyceal diverticula. Mild associated left
hydronephrosis.
3. Spurring of the sacroiliac joints. Lower lumbar degenerative disc
disease.

## 2022-07-02 ENCOUNTER — Other Ambulatory Visit: Payer: Self-pay

## 2022-07-02 ENCOUNTER — Encounter: Payer: Self-pay | Admitting: Family Medicine

## 2022-07-02 DIAGNOSIS — Z1231 Encounter for screening mammogram for malignant neoplasm of breast: Secondary | ICD-10-CM

## 2022-07-07 ENCOUNTER — Other Ambulatory Visit: Payer: Self-pay | Admitting: *Deleted

## 2022-07-07 ENCOUNTER — Telehealth: Payer: Self-pay | Admitting: *Deleted

## 2022-07-07 DIAGNOSIS — Z86010 Personal history of colonic polyps: Secondary | ICD-10-CM

## 2022-07-07 DIAGNOSIS — Z1211 Encounter for screening for malignant neoplasm of colon: Secondary | ICD-10-CM

## 2022-07-07 MED ORDER — NA SULFATE-K SULFATE-MG SULF 17.5-3.13-1.6 GM/177ML PO SOLN: 1.0000 | Freq: Once | ORAL | 0 refills | Status: AC

## 2022-07-07 NOTE — Telephone Encounter (Signed)
Gastroenterology Pre-Procedure Review  Request Date: 08/05/2022 Requesting Physician: Dr. Marius Ditch  PATIENT REVIEW QUESTIONS: The patient responded to the following health history questions as indicated:    1. Are you having any GI issues? no 2. Do you have a personal history of Polyps? yes (last colonoscopy 07/07/2017) 3. Do you have a family history of Colon Cancer or Polyps? no 4. Diabetes Mellitus? no 5. Joint replacements in the past 12 months?no 6. Major health problems in the past 3 months?no 7. Any artificial heart valves, MVP, or defibrillator?no    MEDICATIONS & ALLERGIES:    Patient reports the following regarding taking any anticoagulation/antiplatelet therapy:   Plavix, Coumadin, Eliquis, Xarelto, Lovenox, Pradaxa, Brilinta, or Effient? no Aspirin? no  Patient confirms/reports the following medications:  Current Outpatient Medications  Medication Sig Dispense Refill   Na Sulfate-K Sulfate-Mg Sulf 17.5-3.13-1.6 GM/177ML SOLN Take 1 kit by mouth once for 1 dose. 354 mL 0   cefdinir (OMNICEF) 300 MG capsule 1 BY MOUTH TWICE A DAY FOR 7D  0   ciprofloxacin (CIPRO) 500 MG tablet TAKE 1 TABLET BY MOUTH TWICE A DAY FOR 7 DAYS  0   ibuprofen (ADVIL,MOTRIN) 600 MG tablet Take 1 tablet (600 mg total) by mouth every 8 (eight) hours as needed for fever, mild pain or moderate pain. 30 tablet 0   ondansetron (ZOFRAN ODT) 4 MG disintegrating tablet Allow 1-2 tablets to dissolve in your mouth every 8 hours as needed for nausea/vomiting (Patient not taking: Reported on 11/19/2017) 30 tablet 0   No current facility-administered medications for this visit.    Patient confirms/reports the following allergies:  No Known Allergies  No orders of the defined types were placed in this encounter.   AUTHORIZATION INFORMATION Primary Insurance: 1D#: Group #:  Secondary Insurance: 1D#: Group #:  SCHEDULE INFORMATION: Date: 08/05/2022 Time: Location: [facility]

## 2022-07-28 ENCOUNTER — Encounter: Payer: Self-pay | Admitting: Gastroenterology

## 2022-08-05 ENCOUNTER — Ambulatory Visit: Payer: BLUE CROSS/BLUE SHIELD | Admitting: Anesthesiology

## 2022-08-05 ENCOUNTER — Encounter
Admission: RE | Disposition: A | Discharge status: Home / Self Care | Payer: Self-pay | Source: Home / Self Care | Attending: Gastroenterology

## 2022-08-05 ENCOUNTER — Encounter: Payer: Self-pay | Admitting: Gastroenterology

## 2022-08-05 ENCOUNTER — Ambulatory Visit
Admission: RE | Admit: 2022-08-05 | Discharge: 2022-08-05 | Disposition: A | Discharge status: Home / Self Care | Payer: BLUE CROSS/BLUE SHIELD | Attending: Gastroenterology | Admitting: Gastroenterology

## 2022-08-05 ENCOUNTER — Other Ambulatory Visit: Payer: Self-pay

## 2022-08-05 DIAGNOSIS — D123 Benign neoplasm of transverse colon: Secondary | ICD-10-CM | POA: Diagnosis not present

## 2022-08-05 DIAGNOSIS — Z1211 Encounter for screening for malignant neoplasm of colon: Secondary | ICD-10-CM | POA: Diagnosis not present

## 2022-08-05 DIAGNOSIS — Z860100 Personal history of colon polyps, unspecified: Secondary | ICD-10-CM

## 2022-08-05 DIAGNOSIS — K635 Polyp of colon: Secondary | ICD-10-CM | POA: Diagnosis not present

## 2022-08-05 DIAGNOSIS — K5730 Diverticulosis of large intestine without perforation or abscess without bleeding: Secondary | ICD-10-CM | POA: Diagnosis not present

## 2022-08-05 DIAGNOSIS — Z6841 Body Mass Index (BMI) 40.0 and over, adult: Secondary | ICD-10-CM | POA: Insufficient documentation

## 2022-08-05 HISTORY — PX: POLYPECTOMY: SHX5525

## 2022-08-05 HISTORY — DX: Unspecified osteoarthritis, unspecified site: M19.90

## 2022-08-05 HISTORY — DX: Unspecified hemorrhoids: K64.9

## 2022-08-05 HISTORY — PX: COLONOSCOPY WITH PROPOFOL: SHX5780

## 2022-08-05 HISTORY — DX: Personal history of urinary calculi: Z87.442

## 2022-08-05 SURGERY — COLONOSCOPY WITH PROPOFOL
Anesthesia: General | Site: Rectum

## 2022-08-05 MED ORDER — LACTATED RINGERS IV SOLN: INTRAVENOUS | Status: DC

## 2022-08-05 MED ORDER — STERILE WATER FOR IRRIGATION IR SOLN
Status: DC | PRN
  Administered 2022-08-05: 1

## 2022-08-05 MED ORDER — PROPOFOL 10 MG/ML IV BOLUS
INTRAVENOUS | Status: DC | PRN
  Administered 2022-08-05: 40 mg via INTRAVENOUS
  Administered 2022-08-05: 30 mg via INTRAVENOUS
  Administered 2022-08-05: 40 mg via INTRAVENOUS
  Administered 2022-08-05: 70 mg via INTRAVENOUS

## 2022-08-05 MED ORDER — SODIUM CHLORIDE 0.9 % IV SOLN: INTRAVENOUS | Status: DC

## 2022-08-05 MED ORDER — STERILE WATER FOR IRRIGATION IR SOLN
Status: DC | PRN
  Administered 2022-08-05: 120 mL

## 2022-08-05 MED ORDER — LIDOCAINE HCL (CARDIAC) PF 100 MG/5ML IV SOSY
PREFILLED_SYRINGE | INTRAVENOUS | Status: DC | PRN
  Administered 2022-08-05: 80 mg via INTRAVENOUS

## 2022-08-05 SURGICAL SUPPLY — 25 items
CLIP HMST 235XBRD CATH ROT (MISCELLANEOUS) IMPLANT
CLIP RESOLUTION 360 11X235 (MISCELLANEOUS)
ELECT REM PT RETURN 9FT ADLT (ELECTROSURGICAL)
ELECTRODE REM PT RTRN 9FT ADLT (ELECTROSURGICAL) IMPLANT
FCP ESCP3.2XJMB 240X2.8X (MISCELLANEOUS)
FORCEPS BIOP RAD 4 LRG CAP 4 (CUTTING FORCEPS) IMPLANT
FORCEPS BIOP RJ4 240 W/NDL (MISCELLANEOUS)
FORCEPS ESCP3.2XJMB 240X2.8X (MISCELLANEOUS) IMPLANT
GOWN CVR UNV OPN BCK APRN NK (MISCELLANEOUS) ×4 IMPLANT
GOWN ISOL THUMB LOOP REG UNIV (MISCELLANEOUS) ×4
INJECTOR VARIJECT VIN23 (MISCELLANEOUS) IMPLANT
KIT DEFENDO VALVE AND CONN (KITS) IMPLANT
KIT PRC NS LF DISP ENDO (KITS) ×2 IMPLANT
KIT PROCEDURE OLYMPUS (KITS) ×2
MANIFOLD NEPTUNE II (INSTRUMENTS) ×2 IMPLANT
MARKER SPOT ENDO TATTOO 5ML (MISCELLANEOUS) IMPLANT
PROBE APC STR FIRE (PROBE) IMPLANT
RETRIEVER NET ROTH 2.5X230 LF (MISCELLANEOUS) IMPLANT
SNARE COLD EXACTO (MISCELLANEOUS) IMPLANT
SNARE SHORT THROW 13M SML OVAL (MISCELLANEOUS) IMPLANT
SNARE SHORT THROW 30M LRG OVAL (MISCELLANEOUS) IMPLANT
SNARE SNG USE RND 15MM (INSTRUMENTS) IMPLANT
TRAP ETRAP POLY (MISCELLANEOUS) IMPLANT
VARIJECT INJECTOR VIN23 (MISCELLANEOUS)
WATER STERILE IRR 250ML POUR (IV SOLUTION) ×2 IMPLANT

## 2022-08-05 NOTE — Anesthesia Postprocedure Evaluation (Signed)
Anesthesia Post Note  Patient: Jaime Brock  Procedure(s) Performed: COLONOSCOPY WITH PROPOFOL (Rectum) POLYPECTOMY (Rectum)  Patient location during evaluation: PACU Anesthesia Type: General Level of consciousness: awake and alert Pain management: pain level controlled Vital Signs Assessment: post-procedure vital signs reviewed and stable Respiratory status: spontaneous breathing, nonlabored ventilation, respiratory function stable and patient connected to nasal cannula oxygen Cardiovascular status: blood pressure returned to baseline and stable Postop Assessment: no apparent nausea or vomiting Anesthetic complications: no   No notable events documented.   Last Vitals:  Vitals:   08/05/22 0808 08/05/22 0815  BP: 105/64 113/71  Pulse: 78 71  Resp: (!) 23 (!) 32  Temp: 36.6 C 36.6 C  SpO2: 96% 96%    Last Pain:  Vitals:   08/05/22 0815  TempSrc:   PainSc: 0-No pain                 Martha Clan

## 2022-08-05 NOTE — Op Note (Signed)
[facility] Gastroenterology Patient Name: Jaime Brock Procedure Date: 08/05/2022 7:16 AM MRN: [id number] Account #: [id number] Date of Birth: 06-02-1965 Admit Type: Outpatient Age: 57 Room: [facility] OR ROOM 01 Gender: Female Note Status: Finalized Instrument Name: [id number] Procedure:             Colonoscopy Indications:           Surveillance: Personal history of adenomatous polyps                         on last colonoscopy 5 years ago, Last colonoscopy:                         February 2019 Providers:             Lin Landsman MD, MD Medicines:             General Anesthesia Complications:         No immediate complications. Estimated blood loss: None. Procedure:             Pre-Anesthesia Assessment:                        - Prior to the procedure, a History and Physical was                         performed, and patient medications and allergies were                         reviewed. The patient is competent. The risks and                         benefits of the procedure and the sedation options and                         risks were discussed with the patient. All questions                         were answered and informed consent was obtained.                         Patient identification and proposed procedure were                         verified by the physician, the nurse, the                         anesthesiologist, the anesthetist and the technician                         in the pre-procedure area in the procedure room in the                         endoscopy suite. Mental Status Examination: alert and                         oriented. Airway Examination: normal oropharyngeal                         airway and neck mobility. Respiratory  Examination:                         clear to auscultation. CV Examination: normal.                         Prophylactic Antibiotics: The patient does not require                         prophylactic  antibiotics. Prior Anticoagulants: The                         patient has taken no anticoagulant or antiplatelet                         agents. ASA Grade Assessment: III - A patient with                         severe systemic disease. After reviewing the risks and                         benefits, the patient was deemed in satisfactory                         condition to undergo the procedure. The anesthesia                         plan was to use general anesthesia. Immediately prior                         to administration of medications, the patient was                         re-assessed for adequacy to receive sedatives. The                         heart rate, respiratory rate, oxygen saturations,                         blood pressure, adequacy of pulmonary ventilation, and                         response to care were monitored throughout the                         procedure. The physical status of the patient was                         re-assessed after the procedure.                        After obtaining informed consent, the colonoscope was                         passed under direct vision. Throughout the procedure,                         the patient's blood pressure, pulse, and oxygen  saturations were monitored continuously. The                         Colonoscope was introduced through the anus and                         advanced to the the cecum, identified by appendiceal                         orifice and ileocecal valve. The colonoscopy was                         performed without difficulty. The patient tolerated                         the procedure well. The quality of the bowel                         preparation was evaluated using the BBPS [facility] Bowel                         Preparation Scale) with scores of: Right Colon = 3,                         Transverse Colon = 3 and Left Colon = 3 (entire mucosa                         seen  well with no residual staining, small fragments                         of stool or opaque liquid). The total BBPS score                         equals 9. The ileocecal valve, appendiceal orifice,                         and rectum were photographed. Findings:      The perianal and digital rectal examinations were normal. Pertinent       negatives include normal sphincter tone and no palpable rectal lesions.      A diminutive polyp was found in the transverse colon. The polyp was       sessile. The polyp was removed with a cold biopsy forceps. Resection and       retrieval were complete. Estimated blood loss: none.      Many diverticula were found in the sigmoid colon.      The retroflexed view of the distal rectum and anal verge was normal and       showed no anal or rectal abnormalities. Impression:            - One diminutive polyp in the transverse colon,                         removed with a cold biopsy forceps. Resected and                         retrieved.                        -  Diverticulosis in the sigmoid colon.                        - The distal rectum and anal verge are normal on                         retroflexion view. Recommendation:        - Discharge patient to home (with escort).                        - Resume previous diet today.                        - Continue present medications.                        - Await pathology results.                        - Repeat colonoscopy in 5 to 7 years for surveillance                         based on pathology results. Procedure Code(s):     --- Professional ---                        [phone], Colonoscopy, flexible; with biopsy, single or                         multiple Diagnosis Code(s):     --- Professional ---                        Z86.010, Personal history of colonic polyps                        D12.3, Benign neoplasm of transverse colon (hepatic                         flexure or splenic flexure)                         K57.30, Diverticulosis of large intestine without                         perforation or abscess without bleeding CPT copyright 2022 American Medical Association. All rights reserved. The codes documented in this report are preliminary and upon coder review may  be revised to meet current compliance requirements. Dr. Ulyess Mort Lin Landsman MD, MD 08/05/2022 8:03:30 AM This report has been signed electronically. Number of Addenda: 0 Note Initiated On: 08/05/2022 7:16 AM Scope Withdrawal Time: 0 hours 9 minutes 18 seconds  Total Procedure Duration: 0 hours 12 minutes 58 seconds  Estimated Blood Loss:  Estimated blood loss: none.      [facility]

## 2022-08-05 NOTE — Anesthesia Preprocedure Evaluation (Signed)
Anesthesia Evaluation  Patient identified by MRN, date of birth, ID band Patient awake    Reviewed: Allergy & Precautions, H&P , NPO status , Patient's Chart, lab work & pertinent test results, reviewed documented beta blocker date and time   History of Anesthesia Complications Negative for: history of anesthetic complications  Airway Mallampati: IV  TM Distance: >3 FB Neck ROM: full    Dental  (+) Dental Advidsory Given, Teeth Intact   Pulmonary neg pulmonary ROS   Pulmonary exam normal breath sounds clear to auscultation       Cardiovascular Exercise Tolerance: Good negative cardio ROS Normal cardiovascular exam Rhythm:regular Rate:Normal     Neuro/Psych negative neurological ROS  negative psych ROS   GI/Hepatic negative GI ROS, Neg liver ROS,,,  Endo/Other  neg diabetes  Morbid obesity  Renal/GU negative Renal ROS  negative genitourinary   Musculoskeletal   Abdominal   Peds  Hematology negative hematology ROS (+)   Anesthesia Other Findings Past Medical History: No date: Arthritis     Comment:  Knees No date: Hemorrhoids No date: History of kidney stones 09/16/2017: Low grade mucinous neoplasm of appendix   Reproductive/Obstetrics negative OB ROS                             Anesthesia Physical Anesthesia Plan  ASA: 3  Anesthesia Plan: General   Post-op Pain Management:    Induction: Intravenous  PONV Risk Score and Plan: 3 and Propofol infusion and TIVA  Airway Management Planned: Natural Airway and Nasal Cannula  Additional Equipment:   Intra-op Plan:   Post-operative Plan:   Informed Consent: I have reviewed the patients History and Physical, chart, labs and discussed the procedure including the risks, benefits and alternatives for the proposed anesthesia with the patient or authorized representative who has indicated his/her understanding and acceptance.      Dental Advisory Given  Plan Discussed with: Anesthesiologist, CRNA and Surgeon  Anesthesia Plan Comments:        Anesthesia Quick Evaluation

## 2022-08-05 NOTE — H&P (Signed)
Cephas Darby, MD [address]  [place]  [place], [place] [address]  Main: [phone]  Fax: [phone] Pager: [phone]  Primary Care Physician:  [facility] Primary Gastroenterologist:  Dr. Cephas Darby  Pre-Procedure History & Physical: HPI:  Jaime Brock is a 57 y.o. female is here for an colonoscopy.   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 Authorizing Provider  acetaminophen (TYLENOL) 500 MG tablet Take 500 mg by mouth every 6 (six) hours as needed for moderate pain.   Yes [provider]  Cyanocobalamin (VITAMIN B-12 PO) Take by mouth daily.   Yes [provider]  ibuprofen (ADVIL,MOTRIN) 600 MG tablet Take 1 tablet (600 mg total) by mouth every 8 (eight) hours as needed for fever, mild pain or moderate pain. 08/31/17  Yes Olean Ree, MD    Allergies as of 07/07/2022   (No Known Allergies)    Family History  Problem Relation Age of Onset   Diabetes Mother    Gallbladder disease Mother    Hypertension Mother    Breast cancer Neg Hx    Cancer Neg Hx     Social History   Socioeconomic History   Marital status: Widowed    Spouse name: Not on file   Number of children: Not on file   Years of education: Not on file   Highest education level: Not on file   Occupational History   Not on file  Tobacco Use   Smoking status: Never   Smokeless tobacco: Never  Vaping Use   Vaping Use: Never used  Substance and Sexual Activity   Alcohol use: No   Drug use: No   Sexual activity: Not on file  Other Topics Concern   Not on file  Social History Narrative   Not on file   Social Determinants of Health   Financial Resource Strain: Not on file  Food Insecurity: Not on file  Transportation Needs: Not on file  Physical Activity: Not on file  Stress: Not on file  Social Connections: Not on file  Intimate Partner Violence: Not on file    Review of Systems: See HPI, otherwise negative ROS  Physical Exam: BP 106/62   Pulse 79   Temp 99.1 F (37.3 C) (Temporal)   Resp 19   Ht 4\' 11"  (1.499 m)   Wt 101.6 kg   LMP 08/17/2017 (Approximate) Comment: Pt denies any chance of preg, pt does not wnat to take preg test.   SpO2 95%   BMI 45.24 kg/m  General:   Alert,  pleasant and cooperative in NAD Head:  Normocephalic and atraumatic. Neck:  Supple; no masses or thyromegaly. Lungs:  Clear throughout to auscultation.    Heart:  Regular rate and rhythm. Abdomen:  Soft, nontender and nondistended. Normal bowel  sounds, without guarding, and without rebound.   Neurologic:  Alert and  oriented x4;  grossly normal neurologically.  Impression/Plan: Jaime Brock is here for an colonoscopy to be performed for h/o colon adenoma  Risks, benefits, limitations, and alternatives regarding  colonoscopy have been reviewed with the patient.  Questions have been answered.  All parties agreeable.   Sherri Sear, MD  08/05/2022, 7:35 AM

## 2022-08-05 NOTE — Transfer of Care (Signed)
Immediate Anesthesia Transfer of Care Note  Patient: Jaime Brock  Procedure(s) Performed: COLONOSCOPY WITH PROPOFOL (Rectum) POLYPECTOMY (Rectum)  Patient Location: PACU  Anesthesia Type: General  Level of Consciousness: awake, alert  and patient cooperative  Airway and Oxygen Therapy: Patient Spontanous Breathing and Patient connected to supplemental oxygen  Post-op Assessment: Post-op Vital signs reviewed, Patient's Cardiovascular Status Stable, Respiratory Function Stable, Patent Airway and No signs of Nausea or vomiting  Post-op Vital Signs: Reviewed and stable  Complications: No notable events documented.

## 2022-08-09 LAB — SURGICAL PATHOLOGY

## 2022-08-16 ENCOUNTER — Encounter: Payer: Self-pay | Admitting: Gastroenterology

## 2023-01-18 ENCOUNTER — Ambulatory Visit (INDEPENDENT_AMBULATORY_CARE_PROVIDER_SITE_OTHER): Payer: BLUE CROSS/BLUE SHIELD | Admitting: Vascular Surgery

## 2023-01-18 VITALS — BP 116/75 | HR 71 | Resp 18 | Ht 60.0 in | Wt 237.8 lb

## 2023-01-18 DIAGNOSIS — L97211 Non-pressure chronic ulcer of right calf limited to breakdown of skin: Secondary | ICD-10-CM | POA: Diagnosis not present

## 2023-01-18 NOTE — Patient Instructions (Signed)
lcera varicosa Venous Ulcer Una lcera varicosa es una llaga superficial e irregular en la parte baja de la pierna cuya causa es la mala circulacin venosa. Las lceras varicosas son el tipo ms frecuente de lceras de la parte inferior de la pierna. Estas pueden aparecer en una o en ambas piernas. La zona donde esta afeccin se manifiesta con ms frecuencia es alrededor [employer]. Rolan [place]a varicosa puede durar mucho tiempo (lcera crnica) o reaparecer reiteradamente (lcera recurrente). Cules son las causas? Una lcera varicosa puede deberse a cualquier afeccin que cause un flujo sanguneo deficiente desde las piernas Psychologist, occupational. Las venas tienen vlvulas que ayudan a que la sangre retorne al corazn. Si estas vlvulas no funcionan bien: La sangre puede retroceder y [employer] parte inferior de las piernas. La sangre puede filtrarse de las venas, lo que puede irritar la piel o hacer que la piel se oscurezca sobre la pantorrilla/tibia. Una irritacin o un traumatismo pueden causar una rotura en la piel, que se convierte en una lcera varicosa. Qu incrementa el riesgo? Es ms probable que tenga esta afeccin si: Es mujer. Tiene 55 aos o ms. Ya tuvo una lcera en la pierna. Tiene venas varicosas. Tiene cogulos en las venas de la parte inferior de las piernas (trombosis venosa profunda). Tiene inflamacin en las venas de las piernas (flebitis). Ha tenido un Psychiatrist reciente o tiene antecedentes de Lexicographer. Tiene antecedentes familiares de insuficiencia venosa crnica. Puede correr un riesgo ms alto si: No es activo. Tiene sobrepeso. Consume productos que contienen nicotina o tabaco. Cules son los signos o sntomas? El sntoma principal de esta afeccin es una llaga abierta cerca del tobillo. Otros sntomas pueden incluir: Hinchazn. Lquido que supura de Engineer, site. Sangrado. Picazn. Olor ftido. Dolor e hinchazn que empeoran al estar de pie y  que se alivian al elevar la pierna. Cambios en la piel, tales como: Engrosamiento de la piel. Piel manchada. Piel ms oscura en la pantorrilla. Cmo se diagnostica? El Production manager un examen fsico y le har preguntas sobre sus antecedentes mdicos. Es posible que le realicen otros estudios, como los siguientes: Determinacin de la presin arterial de los brazos y las piernas. Una ecografa para medir el flujo sanguneo en las venas de las piernas. Una exploracin por tomografa computarizada (TC) para identificar vlvulas daadas. Cmo se trata? El tratamiento para esta afeccin puede incluir lo siguiente: Mantener la pierna levantada (elevada). Usar un tipo de venda (vendaje) o de medias para comprimir las venas de las piernas (terapia de compresin). Tomar medicamentos para mejorar el flujo sanguneo. Tomar antibiticos para tratar la infeccin. Limpiar la lcera y eliminar el tejido muerto de la herida (limpieza [place]). Colocar varios tipos de vendas o vendajes medicinales en la lcera. Realizar una ciruga para cerrar la herida con un trozo de piel que se toma de otra parte del cuerpo (injerto). Esto solo se Research scientist (life sciences) las heridas que son profundas o difciles de Environmental health practitioner. Es posible que tenga que probar distintos tipos de tratamientos para Chartered certified accountant cicatrizacin de la lcera varicosa. La cicatrizacin Nurse, children's. Es posible que Programmer, multimedia a Music therapist, como un podlogo o un especialista vascular, para que Production manager pruebas y Physiological scientist. Siga estas instrucciones en su casa: Medicamentos Use o aplquese los medicamentos de venta libre y los recetados solamente como se lo haya indicado el mdico. Si le recetaron antibiticos, selos como se lo haya indicado el mdico. No deje de  tomar los antibiticos aunque comience a Actor. Pregntele al mdico si debe tomar aspirinas antes de Biomedical engineer. Cuidado de la  herida Siga las instrucciones del mdico acerca del cuidado de la herida. Asegrese de hacer lo siguiente: Lvese las manos con agua y jabn durante al menos 20 segundos antes y despus de Multimedia programmer los vendajes. Use desinfectante para manos si no dispone de [place] y [place]. Cambie el vendaje como se lo haya indicado el mdico. Si le han colocado un injerto de piel, no retire los puntos (suturas). Tal vez deban dejarse puestos durante 2 semanas o ms tiempo. Consulte cundo debe retirar el vendaje. Si el vendaje est seco y adherido a la pierna cuando intente quitarlo, humedzcalo o mjelo con solucin salina (agua y sal) o solo con Sports coach. Esto es para que el vendaje pueda retirarse sin daar la piel o el tejido de la herida. Controle la herida [employer] para detectar signos de infeccin. Pdale a un cuidador que lo haga si no puede hacerlo usted mismo. Est atento a los siguientes signos: Aumento del enrojecimiento, la hinchazn o Chief Technology Officer. Ms lquido [place]. Calor. Pus o mal olor. Actividad No permanezca sentado durante mucho tiempo sin moverse. Levntese y camine un poco cada 1 a 2 horas. Esto mejorar el flujo sanguneo y la respiracin. Pida ayuda si se siente dbil o inestable. Descanse con las piernas Conservator, museum/gallery. En lo posible, eleve las piernas por arriba del nivel del corazn, durante 30 minutos, 3 a 4 veces por da, o como se lo haya indicado el mdico. No se siente con las piernas cruzadas. Retome sus actividades normales como se lo haya indicado el mdico. Pregntele al mdico qu actividades son seguras para usted. Instrucciones generales  Use medias elsticas, medias de compresin o medias de descanso como se lo haya indicado el mdico. Eleve el pie de la cama como se lo haya indicado el mdico. No consuma ningn producto que contenga nicotina o tabaco. Estos productos incluyen cigarrillos, tabaco para Theatre manager y aparatos de vapeo, como los Administrator, Civil Service.  Si necesita ayuda para dejar de fumar, consulte al mdico. Trate de seguir una dieta cardiosaludable y baja en sal (sodio). Mantenga un peso corporal adecuado. Concurra a todas las visitas de seguimiento. El mdico controlar si la lcera se est cicatrizando y Multimedia programmer los tratamientos si es necesario. Comunquese con un mdico si: La lcera se est agrandando o no cicatriza. El dolor [place]. Tiene signos de infeccin. Tiene fiebre. Esta informacin no tiene Theme park manager el consejo del mdico. Asegrese de hacerle al mdico cualquier pregunta que tenga. Document Revised: 01/13/2022 Document Reviewed: 01/13/2022 Elsevier Patient Education  2024 [employer].

## 2023-01-18 NOTE — Progress Notes (Signed)
Patient ID: Jaime Brock, female   DOB: 09/18/1965, 57 y.o.   MRN: [id number]  Chief Complaint  Patient presents with   Establish Care    HPI Jaime Brock is a 57 y.o. female.  I am asked to see the patient by Sandrea Hughs for evaluation of venous stasis ulceration of the right lower extremity.  The patient had been dealing with an ulcer on her right anterior lower leg now for about 6 months.  She denies any major trauma or injury or inciting event.  This ultimately healed after the addition of same antibiotics but was very protracted and difficult to heal.  It does have some degree of pain involved with it.  It is more of a heaviness and a burning and that is actually present on both lower extremities.  Occasional mild swelling but that is not a prominent symptom.  There is a dark stasis dermatitis changes present around the small scab that is residual.     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    Past Surgical History:  Procedure Laterality Date   CHOLECYSTECTOMY     COLONOSCOPY WITH PROPOFOL N/A 07/07/2017   Procedure: COLONOSCOPY WITH PROPOFOL;  Surgeon: Toney Reil, MD;  Location: [facility] END[facility]COPY;  Service: Gastroenterology;  Laterality: N/A;   COLONOSCOPY WITH PROPOFOL N/A 08/05/2022   Procedure: COLONOSCOPY WITH PROPOFOL;  Surgeon: Toney Reil, MD;  Location: [facility];  Service: Endoscopy;  Laterality: N/A;   LAPAROSCOPIC APPENDECTOMY N/A 08/31/2017   Procedure: APPENDECTOMY LAPAROSCOPIC;  Surgeon: Henrene Dodge, MD;  Location: [facility] ORS;  Service: General;  Laterality: N/A;   POLYPECTOMY  08/05/2022   Procedure: POLYPECTOMY;  Surgeon: Toney Reil, MD;  Location: [facility];  Service: Endoscopy;;   TUBAL LIGATION       Family History  Problem Relation Age of Onset   Diabetes Mother    Gallbladder disease Mother     Hypertension Mother    Breast cancer Neg Hx    Cancer Neg Hx       Social History   Tobacco Use   Smoking status: Never   Smokeless tobacco: Never  Vaping Use   Vaping status: Never Used  Substance Use Topics   Alcohol use: No   Drug use: No     No Known Allergies  Current Outpatient Medications  Medication Sig Dispense Refill   Cyanocobalamin (VITAMIN B-12 PO) Take by mouth daily.     doxycycline (VIBRA-TABS) 100 MG tablet Take 100 mg by mouth 2 (two) times daily.     acetaminophen (TYLENOL) 500 MG tablet Take 500 mg by mouth every 6 (six) hours as needed for moderate pain. (Patient not taking: Reported on 01/18/2023)     ibuprofen (ADVIL,MOTRIN) 600 MG tablet Take 1 tablet (600 mg total) by mouth every 8 (eight) hours as needed for fever, mild pain or moderate pain. (Patient not taking: Reported on 01/18/2023) 30 tablet 0   No current facility-administered medications for this visit.      REVIEW OF SYSTEMS (Negative unless checked)  Constitutional: [] Weight loss  [] Fever  [] Chills Cardiac: [] Chest pain   [] Chest pressure   [] Palpitations   [] Shortness of breath when laying flat   [] Shortness of breath at rest   [] Shortness of breath with exertion. Vascular:  [] Pain in legs with walking   []   Pain in legs at rest   [] Pain in legs when laying flat   [] Claudication   [] Pain in feet when walking  [] Pain in feet at rest  [] Pain in feet when laying flat   [] History of DVT   [] Phlebitis   [] Swelling in legs   [] Varicose veins   [x] Non-healing ulcers Pulmonary:   [] Uses home oxygen   [] Productive cough   [] Hemoptysis   [] Wheeze  [] COPD   [] Asthma Neurologic:  [] Dizziness  [] Blackouts   [] Seizures   [] History of stroke   [] History of TIA  [] Aphasia   [] Temporary blindness   [] Dysphagia   [] Weakness or numbness in arms   [] Weakness or numbness in legs Musculoskeletal:  [x] Arthritis   [] Joint swelling   [] Joint pain   [] Low back pain Hematologic:  [] Easy bruising  [] Easy bleeding    [] Hypercoagulable state   [] Anemic  [] Hepatitis Gastrointestinal:  [] Blood in stool   [] Vomiting blood  [] Gastroesophageal reflux/heartburn   [] Abdominal pain Genitourinary:  [] Chronic kidney disease   [] Difficult urination  [] Frequent urination  [] Burning with urination   [] Hematuria Skin:  [] Rashes   [x] Ulcers   [x] Wounds Psychological:  [] History of anxiety   []  History of major depression.    Physical Exam BP 116/75 (BP Location: Left Arm)   Pulse 71   Resp 18   Ht 5' (1.524 m)   Wt 237 lb 12.8 oz (107.9 kg)   LMP 08/17/2017 (Approximate) Comment: Pt denies any chance of preg, pt does not wnat to take preg test.   BMI 46.44 kg/m  Gen:  WD/WN, NAD. Obese  Head: [place]/AT, No temporalis wasting.  Ear/Nose/Throat: Hearing grossly intact, nares w/o erythema or drainage, oropharynx w/o Erythema/Exudate Eyes: Conjunctiva clear, sclera non-icteric  Neck: trachea midline.  No JVD.  Pulmonary:  Good air movement, respirations not labored, no use of accessory muscles  Cardiac: RRR, no JVD Vascular:  Vessel Right Left  Radial Palpable Palpable                          DP 1+ 1+  PT 1+ 1+   Gastrointestinal:. No masses, surgical incisions, or scars. Musculoskeletal: M/S 5/5 throughout.  Extremities without ischemic changes.  No deformity or atrophy.  Marked stasis dermatitis changes are present in the right anterior mid lower leg with a small scab as the only residual open wound at the bottom of this area.  No significant lower extremity edema is present today. Neurologic: Sensation grossly intact in extremities.  Symmetrical.  Speech is fluent. Motor exam as listed above. Psychiatric: Judgment intact, Mood & affect appropriate for pt's clinical situation. Dermatologic: No rashes or ulcers noted.  No cellulitis or open wounds.    Radiology No results found.  Labs No results found for this or any previous visit (from the past 2160 hour(s)).  Assessment/Plan:  Lower limb ulcer,  calf, right, limited to breakdown of skin (HCC) The patient had a longstanding difficulty heel ulceration on the right anterior calf area.  This is typical of the venous stasis ulceration.  I am also going to check arterial studies given her slow wound healing.  I discussed the pathophysiology and natural history of both arterial and venous disease with the patient some degree today.  I have recommended elevation and compression once the wound has completely healed to try to keep the area healed and avoid any swelling.  I discussed potential treatment options for both arterial and venous disease.  We will  see her back following her noninvasive studies to discuss the results and determine further treatment options      Festus Barren 01/18/2023, 1:18 PM   This note was created with Dragon medical transcription system.  Any errors from dictation are unintentional.

## 2023-01-18 NOTE — Assessment & Plan Note (Signed)
The patient had a longstanding difficulty heel ulceration on the right anterior calf area.  This is typical of the venous stasis ulceration.  I am also going to check arterial studies given her slow wound healing.  I discussed the pathophysiology and natural history of both arterial and venous disease with the patient some degree today.  I have recommended elevation and compression once the wound has completely healed to try to keep the area healed and avoid any swelling.  I discussed potential treatment options for both arterial and venous disease.  We will see her back following her noninvasive studies to discuss the results and determine further treatment options

## 2023-03-02 ENCOUNTER — Ambulatory Visit (INDEPENDENT_AMBULATORY_CARE_PROVIDER_SITE_OTHER): Payer: BLUE CROSS/BLUE SHIELD

## 2023-03-02 ENCOUNTER — Encounter (INDEPENDENT_AMBULATORY_CARE_PROVIDER_SITE_OTHER): Payer: Self-pay | Admitting: Nurse Practitioner

## 2023-03-02 ENCOUNTER — Other Ambulatory Visit (INDEPENDENT_AMBULATORY_CARE_PROVIDER_SITE_OTHER): Payer: Self-pay | Admitting: Vascular Surgery

## 2023-03-02 ENCOUNTER — Ambulatory Visit (INDEPENDENT_AMBULATORY_CARE_PROVIDER_SITE_OTHER): Payer: BLUE CROSS/BLUE SHIELD | Admitting: Nurse Practitioner

## 2023-03-02 VITALS — BP 109/72 | HR 68 | Resp 16 | Wt 236.0 lb

## 2023-03-02 DIAGNOSIS — L97211 Non-pressure chronic ulcer of right calf limited to breakdown of skin: Secondary | ICD-10-CM | POA: Diagnosis not present

## 2023-03-02 DIAGNOSIS — I83012 Varicose veins of right lower extremity with ulcer of calf: Secondary | ICD-10-CM | POA: Diagnosis not present

## 2023-03-03 LAB — VAS US ABI WITH/WO TBI
Left ABI: 1.23
Right ABI: 1.23

## 2023-03-07 ENCOUNTER — Encounter (INDEPENDENT_AMBULATORY_CARE_PROVIDER_SITE_OTHER): Payer: Self-pay | Admitting: Nurse Practitioner

## 2023-03-07 NOTE — Progress Notes (Signed)
Subjective:    Patient ID: Jaime Brock, female    DOB: Dec 13, 1965, 57 y.o.   MRN: [id number] Chief Complaint  Patient presents with   Follow-up    Ultrasound follow up    Jaime Brock is a 57 y.o. female.  She returns today for noninvasive studies regarding her venous stasis ulceration of the right lower extremity.  This appears to be primarily healed today.  She notes that it comes and goes.  She denies any drainage but it is painful and uncomfortable.  She notes a heaviness of her bilateral lower extremities.  She also has stasis dermatitis present around the small ulcerative area.  She notes that she has never tried medical grade compression or elevation.  Today noninvasive studies show evidence of deep venous insufficiency in the right lower extremity.  There is also reflux in the right saphenofemoral junction with normal edition the patient has an ABI of 1.23 bilaterally.  She has strong triphasic tibial artery waveforms bilaterally.    Review of Systems  Cardiovascular:  Positive for leg swelling.  All other systems reviewed and are negative.      Objective:   Physical Exam Vitals reviewed.  HENT:     Head: Normocephalic.  Cardiovascular:     Rate and Rhythm: Normal rate.     Pulses:          Dorsalis pedis pulses are detected w/ Doppler on the right side and detected w/ Doppler on the left side.       Posterior tibial pulses are detected w/ Doppler on the right side and detected w/ Doppler on the left side.  Pulmonary:     Effort: Pulmonary effort is normal.  Musculoskeletal:     Right lower leg: Edema present.     Left lower leg: Edema present.  Skin:    General: Skin is warm and dry.  Neurological:     Mental Status: She is alert and oriented to person, place, and time.  Psychiatric:        Mood and Affect: Mood normal.        Behavior: Behavior normal.        Thought Content: Thought content normal.        Judgment: Judgment normal.     BP 109/72 (BP  Location: Right Arm)   Pulse 68   Resp 16   Wt 236 lb (107 kg)   LMP 08/17/2017 (Approximate) Comment: Pt denies any chance of preg, pt does not wnat to take preg test.   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    Social History   Socioeconomic History   Marital status: Widowed    Spouse name: Not on file   Number of children: Not on file   Years of education: Not on file   Highest education level: Not on file  Occupational History   Not on file  Tobacco Use   Smoking status: Never   Smokeless tobacco: Never  Vaping Use   Vaping status: Never Used  Substance and Sexual Activity   Alcohol use: No   Drug use: No   Sexual activity: Not on file  Other Topics Concern   Not on file  Social History Narrative   Not on file   Social Determinants of Health   Financial Resource Strain: Not  on file  Food Insecurity: Not on file  Transportation Needs: Not on file  Physical Activity: Not on file  Stress: Not on file  Social Connections: Not on file  Intimate Partner Violence: Not on file    Past Surgical History:  Procedure Laterality Date   CHOLECYSTECTOMY     COLONOSCOPY WITH PROPOFOL N/A 07/07/2017   Procedure: COLONOSCOPY WITH PROPOFOL;  Surgeon: Toney Reil, MD;  [facility] END[facility]COPY;  Service: Gastroenterology;  Laterality: N/A;   COLONOSCOPY WITH PROPOFOL N/A 08/05/2022   Procedure: COLONOSCOPY WITH PROPOFOL;  Surgeon: Toney Reil, MD;  Location: [facility];  Service: Endoscopy;  Laterality: N/A;   LAPAROSCOPIC APPENDECTOMY N/A 08/31/2017   Procedure: APPENDECTOMY LAPAROSCOPIC;  Surgeon: Henrene Dodge, MD;  Location: [facility] ORS;  Service: General;  Laterality: N/A;   POLYPECTOMY  08/05/2022   Procedure: POLYPECTOMY;  Surgeon: Toney Reil, MD;  Location: [facility];  Service: Endoscopy;;    TUBAL LIGATION      Family History  Problem Relation Age of Onset   Diabetes Mother    Gallbladder disease Mother    Hypertension Mother    Breast cancer Neg Hx    Cancer Neg Hx     No Known Allergies     Latest Ref Rng & Units 11/20/2017    5:26 AM 11/19/2017   12:43 PM 11/18/2017    7:52 PM  CBC  WBC 3.6 - 11.0 K/uL 10.3  14.6  10.9   Hemoglobin 12.0 - 16.0 g/dL 78.2  95.6  21.3   Hematocrit 35.0 - 47.0 % 34.1  36.0  37.0   Platelets 150 - 440 K/uL 208  225  202       CMP     Component Value Date/Time   NA 143 11/21/2017 0455   K 3.7 11/21/2017 0455   CL 113 (H) 11/21/2017 0455   CO2 24 11/21/2017 0455   GLUCOSE 126 (H) 11/21/2017 0455   BUN 12 11/21/2017 0455   CREATININE 0.67 11/21/2017 0455   CALCIUM 8.6 (L) 11/21/2017 0455   PROT 7.6 11/18/2017 1952   ALBUMIN 3.7 11/18/2017 1952   AST 28 11/18/2017 1952   ALT 16 11/18/2017 1952   ALKPHOS 90 11/18/2017 1952   BILITOT 0.8 11/18/2017 1952   GFRNONAA >60 11/21/2017 0455     VAS [place] ABI WITH/WO TBI  Result Date: 03/03/2023  LOWER EXTREMITY DOPPLER STUDY Patient Name:  Jaime Brock  Date of Exam:   03/02/2023 Medical Rec #: [id number]       Accession #:    [id number] Date of Birth: June 25, 1965        Patient Gender: F Patient Age:   25 years Exam Location:  [facility] Procedure:      VAS [place] ABI WITH/WO TBI Referring Phys: Barbara Cower DEW --------------------------------------------------------------------------------  Indications: Ulceration. Other Factors: Numbnes in left toes. Shooting pain posterior left leg.  Performing Technologist: Hardie Lora RVT  Examination Guidelines: A complete evaluation includes at minimum, Doppler waveform signals and systolic blood pressure reading at the level of bilateral brachial, anterior tibial, and posterior tibial arteries, when vessel segments are accessible. Bilateral testing is considered an integral part of a complete examination. Photoelectric Plethysmograph (PPG)  waveforms and toe systolic pressure readings are included as required and additional duplex testing as needed. Limited examinations for reoccurring indications may be performed as noted.  ABI Findings: +---------+------------------+-----+---------+--------+ Right    Rt Pressure (mmHg)IndexWaveform Comment  +---------+------------------+-----+---------+--------+ Brachial 107                                      +---------+------------------+-----+---------+--------+  PTA      129               1.06 triphasic         +---------+------------------+-----+---------+--------+ DP       150               1.23 triphasic         +---------+------------------+-----+---------+--------+ Great Toe148               1.21                   +---------+------------------+-----+---------+--------+ +---------+------------------+-----+---------+-------+ Left     Lt Pressure (mmHg)IndexWaveform Comment +---------+------------------+-----+---------+-------+ Brachial 122                                     +---------+------------------+-----+---------+-------+ PTA      150               1.23 triphasic        +---------+------------------+-----+---------+-------+ DP       137               1.12 triphasic        +---------+------------------+-----+---------+-------+ Great Toe123               1.01                  +---------+------------------+-----+---------+-------+ +-------+-----------+-----------+------------+------------+ ABI/TBIToday's ABIToday's TBIPrevious ABIPrevious TBI +-------+-----------+-----------+------------+------------+ Right  1.23       1.21                                +-------+-----------+-----------+------------+------------+ Left   1.23       1.01                                +-------+-----------+-----------+------------+------------+  Summary: Right: Resting right ankle-brachial index is within normal range. The right toe-brachial index  is normal. Left: Resting left ankle-brachial index is within normal range. The left toe-brachial index is normal. *See table(s) above for measurements and observations.  Electronically signed by Festus Barren MD on 03/03/2023 at 9:32:49 AM.    Final        Assessment & Plan:   1. 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  The patient describes them as painful, associated with swelling of both feet and ankles.  The patient notes  this is interfering with daily activities and lifestyle.  I have had a long discussion with the patient regarding  varicose veins and why they cause symptoms.  Patient will wear graduated compression stockings class 1 on a daily basis, beginning first thing in the morning and removing them in the evening. The patient is instructed specifically not to sleep in the stockings.    The patient  will also begin using over-the-counter analgesics such as Motrin 600 mg po TID to help control the symptoms.    In addition, behavioral modification including elevation during the day will be initiated.  The patient is also instructed to continue exercising, such as walking, 4-5 times per week.  Pending the results of these changes the  patient will be reevaluated in three months  Further plans will be based on the benefits  of conservative therapy and the ultrasound results.    Current Outpatient Medications on File Prior to Visit  Medication Sig Dispense Refill   Cyanocobalamin (VITAMIN B-12 PO) Take by mouth daily.     acetaminophen (TYLENOL) 500 MG tablet Take 500 mg by mouth every 6 (six) hours as needed for moderate pain. (Patient not taking: Reported on 01/18/2023)     doxycycline (VIBRA-TABS) 100 MG tablet Take 100 mg by mouth 2 (two) times daily. (Patient not taking: Reported on 03/02/2023)     ibuprofen (ADVIL,MOTRIN) 600 MG tablet Take 1 tablet (600 mg  total) by mouth every 8 (eight) hours as needed for fever, mild pain or moderate pain. (Patient not taking: Reported on 01/18/2023) 30 tablet 0   No current facility-administered medications on file prior to visit.    There are no Patient Instructions on file for this visit. No follow-ups on file.   Georgiana Spinner, NP

## 2023-05-12 ENCOUNTER — Encounter (INDEPENDENT_AMBULATORY_CARE_PROVIDER_SITE_OTHER): Payer: Self-pay | Admitting: Nurse Practitioner

## 2023-06-02 ENCOUNTER — Ambulatory Visit (INDEPENDENT_AMBULATORY_CARE_PROVIDER_SITE_OTHER): Payer: BLUE CROSS/BLUE SHIELD | Admitting: Nurse Practitioner

## 2023-06-07 ENCOUNTER — Ambulatory Visit (INDEPENDENT_AMBULATORY_CARE_PROVIDER_SITE_OTHER): Payer: BLUE CROSS/BLUE SHIELD | Admitting: Vascular Surgery

## 2023-06-07 ENCOUNTER — Encounter (INDEPENDENT_AMBULATORY_CARE_PROVIDER_SITE_OTHER): Payer: Self-pay | Admitting: Vascular Surgery

## 2023-06-07 VITALS — BP 127/71 | HR 60 | Resp 16 | Wt 238.8 lb

## 2023-06-07 DIAGNOSIS — I872 Venous insufficiency (chronic) (peripheral): Secondary | ICD-10-CM | POA: Diagnosis not present

## 2023-06-07 DIAGNOSIS — L97211 Non-pressure chronic ulcer of right calf limited to breakdown of skin: Secondary | ICD-10-CM

## 2023-06-07 DIAGNOSIS — M79604 Pain in right leg: Secondary | ICD-10-CM | POA: Diagnosis not present

## 2023-06-07 DIAGNOSIS — M79609 Pain in unspecified limb: Secondary | ICD-10-CM | POA: Insufficient documentation

## 2023-06-07 NOTE — Assessment & Plan Note (Signed)
Previously performed noninvasive study showed normal ABIs and no arterial insufficiency.  She had deep venous reflux with reflux at the saphenofemoral junction and the superficial system on the right side.  Currently, her symptoms are more significant because of musculoskeletal issues than her venous disease.  With most of her venous disease being in the deep venous system, there is a fairly limited role for laser ablation anyway.  I would not recommend any current intervention.  I will plan to see her back in 6 months in follow-up.

## 2023-06-07 NOTE — Assessment & Plan Note (Signed)
Previously performed noninvasive study showed normal ABIs and no arterial insufficiency.  She had deep venous reflux with reflux at the saphenofemoral junction and the superficial system on the right side.  This area has healed and is not currently bothering her.

## 2023-06-07 NOTE — Progress Notes (Signed)
MRN : [id number]  Jaime Brock is a 58 y.o. (03/06/1966) female who presents with chief complaint of  Chief Complaint  Patient presents with   Follow-up    3 month follow up  .  History of Present Illness: Patient returns today in follow up of her leg pain.  She has been diligently wearing her compression socks, elevating her legs, and trying to walk.  The majority of her pain right now is in the right knee.  She has a pending referral to an orthopedic specialist to evaluate this.  She is not having much swelling.  No ulceration or infection.    Current Outpatient Medications  Medication Sig Dispense Refill   Cyanocobalamin (VITAMIN B-12 PO) Take by mouth daily.     Diclofenac Sodium (VOLTAREN PO) Take by mouth daily. Monday-Friday     acetaminophen (TYLENOL) 500 MG tablet Take 500 mg by mouth every 6 (six) hours as needed for moderate pain. (Patient not taking: Reported on 06/07/2023)     doxycycline (VIBRA-TABS) 100 MG tablet Take 100 mg by mouth 2 (two) times daily. (Patient not taking: Reported on 03/02/2023)     ibuprofen (ADVIL,MOTRIN) 600 MG tablet Take 1 tablet (600 mg total) by mouth every 8 (eight) hours as needed for fever, mild pain or moderate pain. (Patient not taking: Reported on 01/18/2023) 30 tablet 0   No current facility-administered medications for this visit.    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    Past Surgical History:  Procedure Laterality Date   CHOLECYSTECTOMY     COLONOSCOPY WITH PROPOFOL N/A 07/07/2017   Procedure: COLONOSCOPY WITH PROPOFOL;  Surgeon: Toney Reil, MD;  Location: [facility];  Service: Gastroenterology;  Laterality: N/A;   COLONOSCOPY WITH PROPOFOL N/A 08/05/2022   Procedure: COLONOSCOPY WITH PROPOFOL;  Surgeon: Toney Reil, MD;  Location: [facility];  Service: Endoscopy;  Laterality: N/A;    LAPAROSCOPIC APPENDECTOMY N/A 08/31/2017   Procedure: APPENDECTOMY LAPAROSCOPIC;  Surgeon: Henrene Dodge, MD;  Location: [facility];  Service: General;  Laterality: N/A;   POLYPECTOMY  08/05/2022   Procedure: POLYPECTOMY;  Surgeon: Toney Reil, MD;  Location: [facility];  Service: Endoscopy;;   TUBAL LIGATION       Social History   Tobacco Use   Smoking status: Never   Smokeless tobacco: Never  Vaping Use   Vaping status: Never Used  Substance Use Topics   Alcohol use: No   Drug use: No       Family History  Problem Relation Age of Onset   Diabetes Mother    Gallbladder disease Mother    Hypertension Mother    Breast cancer Neg Hx    Cancer Neg Hx      No Known Allergies   REVIEW OF SYSTEMS (Negative unless checked)  Constitutional: [] Weight loss  [] Fever  [] Chills Cardiac: [] Chest pain   [] Chest pressure   [] Palpitations   [] Shortness of breath when laying flat   [] Shortness of breath at rest   [] Shortness of breath with exertion. Vascular:  [] Pain in legs with walking   [] Pain in legs at rest   [] Pain in legs when laying flat   [] Claudication   [] Pain in feet when walking  [] Pain in feet at rest  [] Pain in feet when laying flat   [] History of DVT   []   Phlebitis   [x] Swelling in legs   [] Varicose veins   [] Non-healing ulcers Pulmonary:   [] Uses home oxygen   [] Productive cough   [] Hemoptysis   [] Wheeze  [] COPD   [] Asthma Neurologic:  [] Dizziness  [] Blackouts   [] Seizures   [] History of stroke   [] History of TIA  [] Aphasia   [] Temporary blindness   [] Dysphagia   [] Weakness or numbness in arms   [] Weakness or numbness in legs Musculoskeletal:  [x] Arthritis   [] Joint swelling   [x] Joint pain   [] Low back pain Hematologic:  [] Easy bruising  [] Easy bleeding   [] Hypercoagulable state   [] Anemic   Gastrointestinal:  [] Blood in stool   [] Vomiting blood  [] Gastroesophageal reflux/heartburn   [] Abdominal pain Genitourinary:  [] Chronic kidney disease   [] Difficult  urination  [] Frequent urination  [] Burning with urination   [] Hematuria Skin:  [] Rashes   [] Ulcers   [] Wounds Psychological:  [] History of anxiety   []  History of major depression.  Physical Examination  BP 127/71   Pulse 60   Resp 16   Wt 238 lb 12.8 oz (108.3 kg)   LMP 08/17/2017 (Approximate) Comment: Pt denies any chance of preg, pt does not wnat to take preg test.   BMI 46.64 kg/m  Gen:  WD/WN, NAD Head: [place]/AT, No temporalis wasting. Ear/Nose/Throat: Hearing grossly intact, nares w/o erythema or drainage Eyes: Conjunctiva clear. Sclera non-icteric Neck: Supple.  Trachea midline Pulmonary:  Good air movement, no use of accessory muscles.  Cardiac: RRR, no JVD Vascular:  Vessel Right Left  Radial Palpable Palpable                          PT Palpable Palpable  DP Palpable Palpable   Gastrointestinal: soft, non-tender/non-distended. No guarding/reflex.  Musculoskeletal: M/S 5/5 throughout.  No deformity or atrophy. No edema. Neurologic: Sensation grossly intact in extremities.  Symmetrical.  Speech is fluent.  Psychiatric: Judgment intact, Mood & affect appropriate for pt's clinical situation. Dermatologic: No rashes or ulcers noted.  No cellulitis or open wounds.      Labs No results found for this or any previous visit (from the past 2160 hours).  Radiology No results found.  Assessment/Plan  Lower limb ulcer, calf, right, limited to breakdown of skin (HCC) Previously performed noninvasive study showed normal ABIs and no arterial insufficiency.  She had deep venous reflux with reflux at the saphenofemoral junction and the superficial system on the right side.  This area has healed and is not currently bothering her.  Pain in limb Previously performed noninvasive study showed normal ABIs and no arterial insufficiency.  She had deep venous reflux with reflux at the saphenofemoral junction and the superficial system on the right side.  Orthopedic evaluation is  pending and it seems that a lot of this pain is likely from her musculoskeletal system.  Chronic venous insufficiency Previously performed noninvasive study showed normal ABIs and no arterial insufficiency.  She had deep venous reflux with reflux at the saphenofemoral junction and the superficial system on the right side.  Currently, her symptoms are more significant because of musculoskeletal issues than her venous disease.  With most of her venous disease being in the deep venous system, there is a fairly limited role for laser ablation anyway.  I would not recommend any current intervention.  I will plan to see her back in 6 months in follow-up.    Festus Barren, MD  06/07/2023 11:54 AM    This note was  created with Dragon medical transcription system.  Any errors from dictation are purely unintentional

## 2023-06-07 NOTE — Assessment & Plan Note (Signed)
Previously performed noninvasive study showed normal ABIs and no arterial insufficiency.  She had deep venous reflux with reflux at the saphenofemoral junction and the superficial system on the right side.  Orthopedic evaluation is pending and it seems that a lot of this pain is likely from her musculoskeletal system.

## 2023-12-06 ENCOUNTER — Ambulatory Visit (INDEPENDENT_AMBULATORY_CARE_PROVIDER_SITE_OTHER): Payer: BLUE CROSS/BLUE SHIELD | Admitting: Vascular Surgery
